# Patient Record
Sex: Female | Born: 1955 | Race: Black or African American | Hispanic: No | Marital: Married | Smoking: Former smoker
Health system: Southern US, Community
[De-identification: ages and names within clinical notes are randomized; demographics above are authoritative.]

## PROBLEM LIST (undated history)

## (undated) DIAGNOSIS — IMO0001 Reserved for inherently not codable concepts without codable children: Secondary | ICD-10-CM

## (undated) DIAGNOSIS — E785 Hyperlipidemia, unspecified: Secondary | ICD-10-CM

## (undated) DIAGNOSIS — I10 Essential (primary) hypertension: Secondary | ICD-10-CM

## (undated) DIAGNOSIS — I214 Non-ST elevation (NSTEMI) myocardial infarction: Secondary | ICD-10-CM

## (undated) DIAGNOSIS — I639 Cerebral infarction, unspecified: Secondary | ICD-10-CM

## (undated) DIAGNOSIS — T783XXA Angioneurotic edema, initial encounter: Secondary | ICD-10-CM

## (undated) DIAGNOSIS — J40 Bronchitis, not specified as acute or chronic: Secondary | ICD-10-CM

## (undated) DIAGNOSIS — M1990 Unspecified osteoarthritis, unspecified site: Secondary | ICD-10-CM

## (undated) HISTORY — DX: Reserved for inherently not codable concepts without codable children: IMO0001

## (undated) HISTORY — PX: KNEE ARTHROSCOPY W/ MENISCAL REPAIR: SHX1877

## (undated) HISTORY — DX: Non-ST elevation (NSTEMI) myocardial infarction: I21.4

## (undated) HISTORY — DX: Angioneurotic edema, initial encounter: T78.3XXA

## (undated) HISTORY — PX: NECK SURGERY: SHX720

## (undated) HISTORY — DX: Essential (primary) hypertension: I10

## (undated) HISTORY — DX: Cerebral infarction, unspecified: I63.9

## (undated) HISTORY — DX: Hyperlipidemia, unspecified: E78.5

## (undated) SURGERY — Surgical Case
Anesthesia: *Unknown

---

## 1999-03-01 ENCOUNTER — Emergency Department (HOSPITAL_COMMUNITY): Admission: EM | Admit: 1999-03-01 | Discharge: 1999-03-01 | Payer: Self-pay | Admitting: Emergency Medicine

## 1999-03-01 ENCOUNTER — Encounter: Payer: Self-pay | Admitting: Emergency Medicine

## 1999-03-09 ENCOUNTER — Ambulatory Visit (HOSPITAL_COMMUNITY): Admission: RE | Admit: 1999-03-09 | Discharge: 1999-03-09 | Payer: Self-pay | Admitting: Emergency Medicine

## 1999-03-09 ENCOUNTER — Encounter: Payer: Self-pay | Admitting: Emergency Medicine

## 1999-03-31 ENCOUNTER — Emergency Department (HOSPITAL_COMMUNITY): Admission: EM | Admit: 1999-03-31 | Discharge: 1999-03-31 | Payer: Self-pay

## 2001-03-03 ENCOUNTER — Encounter: Payer: Self-pay | Admitting: Cardiology

## 2001-03-03 ENCOUNTER — Ambulatory Visit (HOSPITAL_COMMUNITY): Admission: RE | Admit: 2001-03-03 | Discharge: 2001-03-03 | Payer: Self-pay | Admitting: Cardiology

## 2003-06-17 ENCOUNTER — Inpatient Hospital Stay (HOSPITAL_COMMUNITY): Admission: EM | Admit: 2003-06-17 | Discharge: 2003-06-19 | Payer: Self-pay | Admitting: Emergency Medicine

## 2003-09-26 ENCOUNTER — Ambulatory Visit (HOSPITAL_COMMUNITY): Admission: RE | Admit: 2003-09-26 | Discharge: 2003-09-26 | Payer: Self-pay | Admitting: Family Medicine

## 2004-01-19 ENCOUNTER — Inpatient Hospital Stay (HOSPITAL_COMMUNITY): Admission: AD | Admit: 2004-01-19 | Discharge: 2004-01-23 | Payer: Self-pay | Admitting: Cardiology

## 2004-01-19 ENCOUNTER — Encounter: Payer: Self-pay | Admitting: Emergency Medicine

## 2004-02-17 ENCOUNTER — Emergency Department (HOSPITAL_COMMUNITY): Admission: EM | Admit: 2004-02-17 | Discharge: 2004-02-17 | Payer: Self-pay | Admitting: Emergency Medicine

## 2004-06-22 ENCOUNTER — Emergency Department (HOSPITAL_COMMUNITY): Admission: EM | Admit: 2004-06-22 | Discharge: 2004-06-22 | Payer: Self-pay | Admitting: Family Medicine

## 2004-08-22 ENCOUNTER — Inpatient Hospital Stay (HOSPITAL_COMMUNITY): Admission: EM | Admit: 2004-08-22 | Discharge: 2004-08-27 | Payer: Self-pay | Admitting: Emergency Medicine

## 2004-08-24 ENCOUNTER — Encounter (INDEPENDENT_AMBULATORY_CARE_PROVIDER_SITE_OTHER): Payer: Self-pay | Admitting: Cardiovascular Disease

## 2005-01-04 ENCOUNTER — Inpatient Hospital Stay (HOSPITAL_COMMUNITY): Admission: EM | Admit: 2005-01-04 | Discharge: 2005-01-07 | Payer: Self-pay | Admitting: Emergency Medicine

## 2005-01-05 ENCOUNTER — Encounter (INDEPENDENT_AMBULATORY_CARE_PROVIDER_SITE_OTHER): Payer: Self-pay | Admitting: *Deleted

## 2005-06-06 ENCOUNTER — Emergency Department (HOSPITAL_COMMUNITY): Admission: EM | Admit: 2005-06-06 | Discharge: 2005-06-06 | Payer: Self-pay | Admitting: Emergency Medicine

## 2005-06-13 ENCOUNTER — Emergency Department (HOSPITAL_COMMUNITY): Admission: EM | Admit: 2005-06-13 | Discharge: 2005-06-13 | Payer: Self-pay | Admitting: Emergency Medicine

## 2005-06-29 ENCOUNTER — Inpatient Hospital Stay (HOSPITAL_COMMUNITY): Admission: EM | Admit: 2005-06-29 | Discharge: 2005-07-02 | Payer: Self-pay | Admitting: Emergency Medicine

## 2005-09-24 ENCOUNTER — Emergency Department (HOSPITAL_COMMUNITY): Admission: EM | Admit: 2005-09-24 | Discharge: 2005-09-24 | Payer: Self-pay | Admitting: Emergency Medicine

## 2005-12-19 ENCOUNTER — Emergency Department (HOSPITAL_COMMUNITY): Admission: EM | Admit: 2005-12-19 | Discharge: 2005-12-19 | Payer: Self-pay | Admitting: Emergency Medicine

## 2006-05-25 ENCOUNTER — Emergency Department (HOSPITAL_COMMUNITY): Admission: EM | Admit: 2006-05-25 | Discharge: 2006-05-25 | Payer: Self-pay | Admitting: Emergency Medicine

## 2006-07-03 ENCOUNTER — Emergency Department (HOSPITAL_COMMUNITY): Admission: EM | Admit: 2006-07-03 | Discharge: 2006-07-03 | Payer: Self-pay | Admitting: Emergency Medicine

## 2006-11-27 ENCOUNTER — Emergency Department (HOSPITAL_COMMUNITY): Admission: EM | Admit: 2006-11-27 | Discharge: 2006-11-28 | Payer: Self-pay | Admitting: Emergency Medicine

## 2006-12-06 ENCOUNTER — Emergency Department (HOSPITAL_COMMUNITY): Admission: EM | Admit: 2006-12-06 | Discharge: 2006-12-06 | Payer: Self-pay | Admitting: Emergency Medicine

## 2007-03-13 ENCOUNTER — Emergency Department (HOSPITAL_COMMUNITY): Admission: EM | Admit: 2007-03-13 | Discharge: 2007-03-13 | Payer: Self-pay | Admitting: Emergency Medicine

## 2007-04-10 ENCOUNTER — Emergency Department (HOSPITAL_COMMUNITY): Admission: EM | Admit: 2007-04-10 | Discharge: 2007-04-10 | Payer: Self-pay | Admitting: Emergency Medicine

## 2007-07-18 ENCOUNTER — Observation Stay (HOSPITAL_COMMUNITY): Admission: EM | Admit: 2007-07-18 | Discharge: 2007-07-19 | Payer: Self-pay | Admitting: Emergency Medicine

## 2007-07-27 ENCOUNTER — Inpatient Hospital Stay (HOSPITAL_COMMUNITY): Admission: EM | Admit: 2007-07-27 | Discharge: 2007-07-29 | Payer: Self-pay | Admitting: Emergency Medicine

## 2008-05-13 ENCOUNTER — Emergency Department (HOSPITAL_COMMUNITY): Admission: EM | Admit: 2008-05-13 | Discharge: 2008-05-13 | Payer: Self-pay | Admitting: Emergency Medicine

## 2008-09-28 ENCOUNTER — Inpatient Hospital Stay (HOSPITAL_COMMUNITY): Admission: EM | Admit: 2008-09-28 | Discharge: 2008-10-04 | Payer: Self-pay | Admitting: Neurosurgery

## 2008-09-28 ENCOUNTER — Encounter: Admission: RE | Admit: 2008-09-28 | Discharge: 2008-09-28 | Payer: Self-pay | Admitting: Emergency Medicine

## 2009-06-10 ENCOUNTER — Inpatient Hospital Stay (HOSPITAL_COMMUNITY): Admission: EM | Admit: 2009-06-10 | Discharge: 2009-06-12 | Payer: Self-pay | Admitting: Emergency Medicine

## 2009-06-27 ENCOUNTER — Emergency Department (HOSPITAL_COMMUNITY): Admission: EM | Admit: 2009-06-27 | Discharge: 2009-06-27 | Payer: Self-pay | Admitting: Emergency Medicine

## 2009-12-30 ENCOUNTER — Emergency Department (HOSPITAL_COMMUNITY): Admission: EM | Admit: 2009-12-30 | Discharge: 2009-12-31 | Payer: Self-pay | Admitting: Emergency Medicine

## 2010-05-05 LAB — CBC
HCT: 35.2 % — ABNORMAL LOW (ref 36.0–46.0)
Hemoglobin: 11.8 g/dL — ABNORMAL LOW (ref 12.0–15.0)
MCH: 28.5 pg (ref 26.0–34.0)
MCV: 85 fL (ref 78.0–100.0)
Platelets: UNDETERMINED 10*3/uL (ref 150–400)
RBC: 4.14 MIL/uL (ref 3.87–5.11)
RDW: 14.4 % (ref 11.5–15.5)

## 2010-05-05 LAB — DIFFERENTIAL
Eosinophils Absolute: 0.3 10*3/uL (ref 0.0–0.7)
Lymphocytes Relative: 33 % (ref 12–46)
Lymphs Abs: 2.6 10*3/uL (ref 0.7–4.0)
Monocytes Absolute: 0.5 10*3/uL (ref 0.1–1.0)
Monocytes Relative: 6 % (ref 3–12)
Neutro Abs: 4.5 10*3/uL (ref 1.7–7.7)

## 2010-05-05 LAB — POCT CARDIAC MARKERS
CKMB, poc: 2.4 ng/mL (ref 1.0–8.0)
Myoglobin, poc: 52 ng/mL (ref 12–200)
Troponin i, poc: <0.05 ng/mL (ref 0.00–0.09)

## 2010-05-05 LAB — BASIC METABOLIC PANEL
CO2: 24 mEq/L (ref 19–32)
GFR calc non Af Amer: >60 mL/min (ref >60)
Glucose, Bld: 107 mg/dL — ABNORMAL HIGH (ref 70–99)

## 2010-05-12 LAB — BASIC METABOLIC PANEL
BUN: 14 mg/dL (ref 6–23)
CO2: 24 mEq/L (ref 19–32)
CO2: 28 mEq/L (ref 19–32)
Calcium: 9.1 mg/dL (ref 8.4–10.5)
Calcium: 8.5 mg/dL (ref 8.4–10.5)
Calcium: 9.3 mg/dL (ref 8.4–10.5)
Creatinine, Ser: 0.79 mg/dL (ref 0.4–1.2)
Creatinine, Ser: 0.79 mg/dL (ref 0.4–1.2)
GFR calc Af Amer: >60 mL/min (ref >60)
GFR calc Af Amer: >60 mL/min (ref >60)
GFR calc Af Amer: >60 mL/min (ref >60)
GFR calc non Af Amer: 50 mL/min — ABNORMAL LOW (ref >60)
GFR calc non Af Amer: >60 mL/min (ref >60)
GFR calc non Af Amer: >60 mL/min (ref >60)
Glucose, Bld: 92 mg/dL (ref 70–99)
Sodium: 139 mEq/L (ref 135–145)

## 2010-05-12 LAB — DIFFERENTIAL
Basophils Absolute: 0 10*3/uL (ref 0.0–0.1)
Basophils Relative: 1 % (ref 0–1)
Basophils Relative: 0 % (ref 0–1)
Eosinophils Absolute: 0.3 10*3/uL (ref 0.0–0.7)
Eosinophils Relative: 4 % (ref 0–5)
Lymphocytes Relative: 18 % (ref 12–46)
Lymphs Abs: 1.3 10*3/uL (ref 0.7–4.0)
Monocytes Absolute: 0.7 10*3/uL (ref 0.1–1.0)
Monocytes Relative: 9 % (ref 3–12)
Monocytes Relative: 8 % (ref 3–12)
Neutro Abs: 9.6 10*3/uL — ABNORMAL HIGH (ref 1.7–7.7)
Neutrophils Relative %: 71 % (ref 43–77)
Neutrophils Relative %: 71 % (ref 43–77)

## 2010-05-12 LAB — GLUCOSE, CAPILLARY: Glucose-Capillary: 96 mg/dL (ref 70–99)

## 2010-05-12 LAB — COMPREHENSIVE METABOLIC PANEL
ALT: 13 U/L (ref 0–35)
Albumin: 3.5 g/dL (ref 3.5–5.2)
Alkaline Phosphatase: 110 U/L (ref 39–117)
BUN: 10 mg/dL (ref 6–23)
CO2: 25 mEq/L (ref 19–32)
Calcium: 8.8 mg/dL (ref 8.4–10.5)
Chloride: 105 mEq/L (ref 96–112)
Creatinine, Ser: 0.53 mg/dL (ref 0.4–1.2)
GFR calc non Af Amer: >60 mL/min (ref >60)
Glucose, Bld: 123 mg/dL — ABNORMAL HIGH (ref 70–99)
Glucose, Bld: 144 mg/dL — ABNORMAL HIGH (ref 70–99)
Potassium: 3.8 mEq/L (ref 3.5–5.1)
Sodium: 140 mEq/L (ref 135–145)
Total Bilirubin: 0.5 mg/dL (ref 0.3–1.2)
Total Protein: 7.6 g/dL (ref 6.0–8.3)

## 2010-05-12 LAB — URINALYSIS, ROUTINE W REFLEX MICROSCOPIC
Hgb urine dipstick: NEGATIVE
Nitrite: NEGATIVE
Protein, ur: NEGATIVE mg/dL

## 2010-05-12 LAB — CBC
HCT: 33.8 % — ABNORMAL LOW (ref 36.0–46.0)
Hemoglobin: 11.7 g/dL — ABNORMAL LOW (ref 12.0–15.0)
Hemoglobin: 12.5 g/dL (ref 12.0–15.0)
MCHC: 34.9 g/dL (ref 30.0–36.0)
MCHC: 34.2 g/dL (ref 30.0–36.0)
MCHC: 34.4 g/dL (ref 30.0–36.0)
MCHC: 34.8 g/dL (ref 30.0–36.0)
MCV: 87.4 fL (ref 78.0–100.0)
MCV: 86.8 fL (ref 78.0–100.0)
MCV: 87.8 fL (ref 78.0–100.0)
RBC: 3.86 MIL/uL — ABNORMAL LOW (ref 3.87–5.11)
RBC: 3.41 MIL/uL — ABNORMAL LOW (ref 3.87–5.11)
RBC: 3.52 MIL/uL — ABNORMAL LOW (ref 3.87–5.11)
RBC: 3.95 MIL/uL (ref 3.87–5.11)
RBC: 4.17 MIL/uL (ref 3.87–5.11)
WBC: 7.6 10*3/uL (ref 4.0–10.5)
WBC: 13.6 10*3/uL — ABNORMAL HIGH (ref 4.0–10.5)
WBC: 20.2 10*3/uL — ABNORMAL HIGH (ref 4.0–10.5)
WBC: 21.9 10*3/uL — ABNORMAL HIGH (ref 4.0–10.5)

## 2010-05-12 LAB — URINE MICROSCOPIC-ADD ON

## 2010-05-12 LAB — RAPID STREP SCREEN (MED CTR MEBANE ONLY): Streptococcus, Group A Screen (Direct): POSITIVE — AB

## 2010-05-12 LAB — PROTIME-INR
INR: 1.06 (ref 0.00–1.49)
Prothrombin Time: 13.7 seconds (ref 11.6–15.2)

## 2010-05-12 LAB — APTT: aPTT: 29 seconds (ref 24–37)

## 2010-05-30 LAB — POCT I-STAT, CHEM 8
BUN: 17 mg/dL (ref 6–23)
Chloride: 103 mEq/L (ref 96–112)
Creatinine, Ser: 0.6 mg/dL (ref 0.4–1.2)
Glucose, Bld: 111 mg/dL — ABNORMAL HIGH (ref 70–99)
Potassium: 3.8 mEq/L (ref 3.5–5.1)

## 2010-05-30 LAB — BASIC METABOLIC PANEL
BUN: 8 mg/dL (ref 6–23)
Chloride: 102 mEq/L (ref 96–112)
Glucose, Bld: 121 mg/dL — ABNORMAL HIGH (ref 70–99)
Potassium: 4 mEq/L (ref 3.5–5.1)

## 2010-05-30 LAB — CBC
HCT: 42.1 % (ref 36.0–46.0)
MCV: 89.4 fL (ref 78.0–100.0)
Platelets: 250 10*3/uL (ref 150–400)
RBC: 4.71 MIL/uL (ref 3.87–5.11)
WBC: 10.4 10*3/uL (ref 4.0–10.5)

## 2010-05-30 LAB — DIFFERENTIAL
Eosinophils Absolute: 0 10*3/uL (ref 0.0–0.7)
Eosinophils Relative: 0 % (ref 0–5)
Lymphs Abs: 1.3 10*3/uL (ref 0.7–4.0)
Monocytes Relative: 6 % (ref 3–12)

## 2010-07-07 NOTE — Discharge Summary (Signed)
Teresa [name], Teresa [name]                 ACCOUNT NO.:  [id number]   MEDICAL RECORD NO.:  [id number]          PATIENT TYPE:  INP   LOCATION:  4732                         FACILITY:  [facility]   PHYSICIAN:  Mohan N. Sharyn Lull, M.D. DATE OF BIRTH:  1955/02/26   DATE OF ADMISSION:  07/18/2007  DATE OF DISCHARGE:  07/19/2007                               DISCHARGE SUMMARY   ADMITTING DIAGNOSES:  Chest pain, rule out myocardial infarction,  history of probable small non-Q-wave myocardial infarction in the past,  hypertension, compensated congestive heart failure, history of  cardiovascular accident, history of peptic ulcer disease, chronic  anemia, hypercholesteremia, history of tobacco abuse, and positive  family history of coronary artery disease.   FINAL DIAGNOSIS:  Status post chest pain, myocardial infarction ruled  out, negative Persantine Myoview, history of probable small non-Q-wave  myocardial infarction, hypertension, compensated systolic heart failure,  history of cardiovascular accident, history of peptic ulcer disease,  chronic anemia, hypercholesteremia, history of tobacco abuse, and  positive family history of coronary artery disease.   DISCHARGE HOME MEDICATIONS:  1. Ramipril 10 mg 1 capsule daily.  2. Cozaar 50 mg 1 tablet daily.  3. Lipitor 10 mg 1 daily.  4. Coreg 25 mg 1 tablet every 12 hours.  5. Imdur 30 mg 1 tablet daily.  6. Omeprazole 20 mg 1 tablet twice daily.  7. Aggrenox 25/200 1 capsule twice daily.  8. Nitrostat 0.4 mg sublingual use as directed.   ACTIVITY:  As tolerated.   DIET:  Low-salt, low-cholesterol diet.  The patient has been advised to  avoid sweets.   Follow up with me in 1 week.   CONDITION AT DISCHARGE:  Stable.   BRIEF HISTORY AND HOSPITAL COURSE:  Teresa [name] is a 55 year old black  female with past medical history significant for coronary artery  disease, history of probable very small non-Q-wave myocardial infarction  in the past,  hypertension, history of systolic heart failure, history of  peptic ulcer disease, chronic anemia, history of tobacco abuse, history  of CVA, and hypercholesteremia.  She came to the ER complaining of  retrosternal burning chest pain off and on since this morning, grade  8/10, localized without associated nausea, vomiting, or diaphoresis.  Denies shortness of breath.  Denies palpitation, lightheadedness, or  syncope.  Denies PND, orthopnea, and leg swelling.  States chest pain  feels similar in nature, when she had mild MI.  The patient was noted to  have minimally elevated troponin I of 0.07.  EKG done in the ER showed  normal sinus rhythm with nonspecific T-wave changes.  Denies any  relation of chest pain to food, breathing, or movement.   PAST MEDICAL HISTORY:  As above.   PAST SURGICAL HISTORY:  She had tubal ligation 20+ years ago, had  questionable throat surgery.   ALLERGIES:  No known drug allergies.   SOCIAL HISTORY:  She is married, has 2 children.  Smoked half-pack per  day for 20+ years, quit in 2002.  Works at [employer].  Drinks beer  occasionally socially.   FAMILY HISTORY:  Father died of  accidental death.  Mother is alive.  She  has coronary artery disease.  She is hypertensive, also has history of  congestive heart failure.   MEDICATION:  1. Ramipril 10 mg daily.  2. Cozaar 50 mg p.o. daily.  3. Coreg 25 mg every 12 hours.  4. Imdur 30 mg daily.  5. Lipitor 10 mg daily.  6. Prilosec 20 mg p.o. daily.  7. Aggrenox 1 p.o. b.i.d.   PHYSICAL EXAMINATION:  GENERAL:  She is alert, awake, and oriented x3 in  no acute distress.  VITAL SIGNS:  Blood pressure was 115/69 and pulse was 79 regular.  HEENT:  Conjunctivae were pink.  NECK:  No JVD.  No bruit.  LUNGS:  She had decreased breath sounds at the bases.  HEART:  There was soft systolic murmur.  No S3 or gallop.  ABDOMEN:  Soft.  Bowel sounds present.  Nontender.  EXTREMITIES: There is no clubbing, cyanosis, or  edema.   LABORATORY DATA:  EKG showed normal sinus rhythm with nonspecific T-wave  changes.  Hemoglobin was 10, hematocrit 28.9, and white count of 8.6.  Potassium was 3.7, BUN was 13, creatinine 0.8, and glucose 100.  Troponin I first set in the ER, point of care was 0.07.  Repeat troponin  I were 0.06 and 0.06.  CK was 106, MB was 1.4.  TSH was 2.99.  CRP was  slightly elevated 1.5.  Cholesterol was 130, LDL 51, and HDL was 40.   BRIEF HOSPITAL COURSE:  The patient was admitted to telemetry unit.  MI  was ruled out by serial enzymes and EKG.  The patient did not have any  further episodes of chest pain.  The patient was started on IV nitrates  and heparin.  The patient did not have any further episodes of chest  pain during the hospital stay.  The patient subsequently underwent  Persantine Myoview today, which showed no evidence of reversible  ischemia with EF of 46%.  The patient is off IV heparin and nitrates and  ambulating in hallway without any problems.  The patient will be  discharged home on above medications and he will be followed up in my  office in 1 week.      Eduardo Osier. Sharyn Lull, M.D.  Electronically Signed    MNH/MEDQ  D:  07/19/2007  T:  07/20/2007  Job:  [id number]   cc:   [place]ley [place]

## 2010-07-07 NOTE — Op Note (Signed)
NAMECHANDREA, Teresa [name]                 ACCOUNT NO.:  [id number]   MEDICAL RECORD NO.:  [id number]          PATIENT TYPE:  INP   LOCATION:  3110                         FACILITY:  [facility]   PHYSICIAN:  Clydene Fake, M.D.  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   PROCEDURE:  Anterior cervical decompression, diskectomy, and fusion at  C4-5, C5-6, and C6-7 with LifeNet allograft bone and Trestle anterior  cervical plate.   SURGEON:  Clydene Fake, MD   ANESTHESIA:  General endotracheal tube anesthesia.   ESTIMATED BLOOD LOSS:  300 mL.   BLOOD GIVEN:  None.   DRAINS:  None.   COMPLICATIONS:  None.   REASON FOR PROCEDURE:  The patient is a 55 year old woman with 2-week  history of severe neck and right arm pain, numbness, and weakness.  During the last 5 days had more and more trouble, progressive weakness,  trouble with fine motor skills in the hands worse to the right, started  to dragging her right leg, and some trouble with gait and trouble  controlling her arms and legs like she wants to.  An MRI was done and  the patient was sent to the emergency room because of large acute disk  herniation and with some other levels with spinal change and stenosis.  After evaluating this and talking to patient, it was decided to proceed  with surgery, a 3-level ACF with C4-5, C5-6, and C6-7 because of her  progressive myelopathy.   PROCEDURE IN DETAIL:  The patient was brought to the operating room, and  general anesthesia was induced.  The patient was placed in a  10-pound  Halter traction, prepped and draped in the sterile fashion.  The site of  incision injected with 10 mL of 1% lidocaine with epinephrine.  An  incision was then made from midline to the anterior border of the  sternocleidomastoid muscle on the left side of neck.  Incision was taken  down to the platysma, and hemostasis was obtained with Bovie  cauterization.  The platysma was incised and blunt dissection taken  through the anterior cervical fascia to the anterior cervical spine.  Two needles were placed into 2 interspaces.  X-rays were done and this  confirmed we were at the C4-5 and C5-6 interspaces.  The disk spaces  were incised with a 15 blade and partial diskectomy performed with  pituitary rongeurs as the needle was removed.  Longus colli muscles were  reflected laterally from C4 through C7 bilaterally.  Self-retaining  retractors were placed and we could see the C4-5 and C5-6 interspace.  Large osteophytes were at C5-6 and these were removed with Leksell  rongeur.  We then started diskectomy  with pituitary rongeurs and  curettes, removed anterior spurs at C4-5 level with Kerrison punches.  The C5-6 space was severely spondylotic and we are going have to drilled  down on the left spaces as we are going to do it as a second step after  we take care of all important disk herniation at C4-5.  Distraction pins  were placed in the C4 and C6 and interspace distracted.  Microscope was  brought in for microdissection.  At this point, diskectomy continued at  the C4-5 level with curettes and pituitary rongeurs and 1 and 2-mm  Kerrison punches.  We had to get down to the posterior disk.  We removed  couple of fragments that went looked like through the ligament but then  CSF was seen on x-ray and as these fragments came out of the dura.  We  can see the spinal cord and there were further fragments of disk on top  of spinal cord inside the dura and we carefully removed these  fragments.  There was couple of areas where there was thin membrane of dura still  there anteriorly with 2 large rents, one on left and one on the right.  We could see the spinal cord arachnoid still there. We did have an  opening into the CSF where there was more disk herniation anterior upto  the dura and both going cephalad and caudally and these were removed  with Kerrison punches and carefully then explored inside the dura again,  for confirming fragments.  When we had finished, we had good  decompression of the canal.  Bilateral foramen appeared open.  We placed  some Gelfoam and patties to cover the dura and then used a high-speed  drill to remove cartilaginous endplate, measured the height of disk  space to be 4 mm.  We then placed some Duragen over the dura and over  the dural rents and then tapped a 4-mm LifeNet allograft bone into place  in the C4-5 space.  We then used Tisseel tissue glue and escorted that  into the gutters and over the bone graft.  After that, we never saw CSF  fluid again.  Attention was then taken to C5-6 level where a high-speed  drill was used to drill through the disk space and end plates.  As we  got posteriorly, there was a lot of posterior disk protrusion and also  going cephalad and caudally, some thickened ligament.  We carefully  dissected ligament off the dura and it was adherent, but we were able to  dissect off and then continued with diskectomy, getting good central  decompression and then performing bilateral foraminotomies to make sure  nerve roots got out well.  We used high-speed drill to remove  cartilaginous endplate, measured height of disk space to be 4 mm and a 4-  mm placed LifeNet allograft bone was tapped into place at C5-6 level.  Distraction pins were removed at C4 and pins were placed back into C7,  distracted the interspace at C6-7 with the retractor down so we can see  the C6-7 interspace and this interspace was incised with 15  blade and  diskectomy done with pituitary rongeurs and curettes and then 1 and 2-mm  Kerrison punches were used to remove posterior disk osteophytes and  ligament decompressing the central canal and performing bilateral  foraminotomies.  We we had finished, we had good decompression of  central and bilateral foramen.  There were some fragments of disk seen  centrally and into the right and these were removed.  We irrigated with  antibiotic solution.  We removed cartilaginous endplates with high-speed  drill, measured height of disk space to be 5 mm and a 5-mm LifeNet  allograft bone was tapped into place, and countersunk couple of  millimeters.  Distraction pins were removed.  Hemostasis was obtained  with Gelfoam and thrombin.  Weight was removed from the traction.  A  Trestle anterior cervical plate was placed in the anterior cervical  spine.  Two screws were placed in C4, 2 in C5, 2 in C6, and 2 in C7.  These were tightened down.  Lateral x-rays were obtained and looked like  the C4 screws just through the cortex posteriorly, the others looked  good.  C4 screws were removed and 12 mm screws were placed.  We had good  hemostasis and we placed some more Tisseel tissue at the C4-5 space and  in the gutter at C5-6 with no further CSF seen.  We had very good  hemostasis and the retractors removed.  The platysma was closed with 3-0  Vicryl interrupted suture, subcutaneous tissue closed with 3-0 Vicryl  interrupted suture, and then the skin closed with benzoin and Steri-  Strips.  A dressing was placed.  The patient was placed in a soft  cervical collar, awoken from anesthesia, and transferred to recovery  room in stable condition.           ______________________________  Clydene Fake, M.D.     JRH/MEDQ  D:  09/28/2008  T:  09/29/2008  Job:  [id number]

## 2010-07-10 NOTE — H&P (Signed)
Teresa [name], HUEBSCH                 ACCOUNT NO.:  [id number]   MEDICAL RECORD NO.:  [id number]          PATIENT TYPE:  INP   LOCATION:  4735                         FACILITY:  [facility]   PHYSICIAN:  Ricki Rodriguez, M.D.  DATE OF BIRTH:  05/16/1955   DATE OF ADMISSION:  08/21/2004  DATE OF DISCHARGE:                                HISTORY & PHYSICAL   CHIEF COMPLAINT:  Difficulty with speech and drooling on the left side.   HISTORY OF PRESENT ILLNESS:  This 55 year old black female complained of  right-handed weakness with a difficulty in calling the dog followed by  drooling on the left side and speech disturbance lasting for two to three  hours. She also had difficulty focusing. Now, the patient feels near normal.  No chest pain but has some headache.   PAST MEDICAL HISTORY:  Negative for diabetes. Positive for hypertension for  three years. She quit smoking January 19, 2004 when she had a myocardial  infarction but a cardiac catheterization was unremarkable according to the  patient. No history of cholesterol elevation and positive family history of  coronary artery disease.   PAST SURGICAL HISTORY:  Tubal ligation 22 years ago.   CURRENT MEDICATIONS:  1.  Altace 10 mg 1 daily.  2.  Lasix 20 mg 1 daily.  3.  Nitroglycerin 0.4 mg 1 sublingual every five minutes x3 p.r.n. chest      pain.  4.  Imdur 30 mg 1 daily.   ALLERGIES:  No known drug allergies.   SOCIAL HISTORY:  The patient is married. Husband, Laban Emperor, is a 77 year old  and has hypertension. The patient has two sons, a 64 and 16 year old.   FAMILY HISTORY:  Mother alive at 53 with hypertension. Father died of  accident at the age of 34. The patient has one sister living and well at age  35. Does not have any brothers.   REVIEW OF SYSTEMS:  Unremarkable. Positive weight gain of 11 pounds in six  months after quitting smoking. Wears partial upper dentures. No history of  asthma, pneumonia, or hemoptysis. No history  of chest pain. Positive history  of shortness of breath. No leg edema, palpitation, or dizziness. No history  of GI bleed, hepatitis, but had a blood transfusion six months ago. No  history of kidney stones, strokes, seizures, psychiatric admissions, and  immunization not up to date.   PHYSICAL EXAMINATION:  VITAL SIGNS:  Pulse 96, respiratory rate 16, blood  pressure 160/100. Height 5 feet 7 inch. Weight 171 pounds.  HEENT:  The patient is normocephalic and atraumatic with pupils equal and  reactive to light. Extraocular movements intact. Has brown eyes and wears  wig.  NECK:  No JVD and no carotid bruit.  LUNGS:  Clear bilaterally.  HEART:  Normal S1 and S2.  ABDOMEN:  Soft and nontender.  EXTREMITIES:  No clubbing, cyanosis, or edema.  NEUROLOGICAL:  The patient moves all four extremities and has bilaterally  equal grips.   LABORATORY DATA:  Pending.   IMPRESSION:  1.  Hypertension.  2.  Transient ischemic attack.  RECOMMENDATIONS:  This patient will be admitted. She will have carotid  Doppler and 2-D echocardiogram. She will be started on Aggrenox twice daily  and continue home medications. She may need a neurologic evaluation. She may  need a repeat CT of her brain to rule out stroke.       ASK/MEDQ  D:  08/22/2004  T:  08/22/2004  Job:  [id number]

## 2010-07-10 NOTE — Discharge Summary (Signed)
[name], [name]                 ACCOUNT NO.:  [id number]   MEDICAL RECORD NO.:  [id number]          PATIENT TYPE:  INP   LOCATION:  3701                         FACILITY:  [facility]   PHYSICIAN:  Mohan N. Sharyn Lull, M.D. DATE OF BIRTH:  1956/01/09   DATE OF ADMISSION:  01/19/2004  DATE OF DISCHARGE:  01/23/2004                                 DISCHARGE SUMMARY   ADMISSION DIAGNOSES:  1.  Probable small non-Q-wave myocardial infarction.  2.  Decompensated congestive heart failure; etiology multifactorial, i.e.,      anemia, uncontrolled hypertension and myocardial infarction.  3.  Uncontrolled hypertension secondary to noncompliance to medication.  4.  Tobacco abuse.  5.  Hypochromic microcytic anemia.  6.  History of menorrhagia.  7.  Positive family history of coronary artery disease.   DISCHARGE DIAGNOSES:  1.  Status post non-Q-wave myocardial infarction.  2.  Compensated congestive heart failure.  3.  Hypertension.  4.  Hypochromic microcytic chronic anemia.  5.  History of menorrhagia.  6.  Positive family history of coronary artery disease.  7.  Tobacco abuse.   DISCHARGE HOME MEDICATIONS:  1.  Coreg 12.5 mg half tablet every 12 hours.  2.  Altace 10 mg one capsule daily.  3.  Baby aspirin 81 mg one tablet daily.  4.  Feosol 325 mg one tablet three times per day.   ACTIVITY:  Avoid heavy lifting, pushing or pulling for 48 hours.   DISCHARGE DIET:  Low salt, low cholesterol diet.   Post cardiac catheterization instructions have been given.   FOLLOW UP:  With me in one week and the patient has been advised to schedule  an appointment with OB/GYN for evaluation of menorrhagia.   CONDITION ON DISCHARGE:  Stable.   HISTORY OF PRESENT ILLNESS:  [name] is a 55 year old black female with  past medical history significant for hypertension, tobacco abuse, chronic  menorrhagia, positive family history of coronary artery disease.  She came  to the ER complaining of  retrosternal chest pain described as pressure,  tightness, grade 9/10, localized, increased with deep breathing and lying  down since 8 p.m. last night; associated with mild shortness of breath.  Denies any nausea, vomiting, or diaphoresis.  Denies palpitations,  lightheadedness or syncope.  Denies PND, orthopnea or leg swelling.  The  patient gives history of exertional chest pain, dyspnea, feeling weak and  tired for the last few months.  She states she has stopped her BP medication  approximately three or four months ago.  She also complains of chronic  menorrhagia lasting six days of menstrual cycle every month with large blood  clots.  She denies any abdominal pain, nausea, vomiting.  Denies any bright  red blood per rectum.  Denies chest pain at present or shortness of breath.  The patient was noted to be anemic and had also mildly elevated troponin I  in the ER.   PAST MEDICAL HISTORY:  As above.   PAST SURGICAL HISTORY:  She had a tubal ligation in the past.   ALLERGIES:  No known drug allergies.  MEDICATIONS:  None.   SOCIAL HISTORY:  She is married.  She has two children.  Smokes half pack  per day for 22 years.  She drinks beer and whiskey occasionally socially.  No history of drug abuse.   FAMILY HISTORY:  Mother is alive.  She has coronary artery disease.  She is  hypertensive.  Father died of accidental death at the age of 40.  One sister  in good health.   PHYSICAL EXAMINATION:  GENERAL APPEARANCE:  She was awake, alert and  oriented x3 in no acute distress.  VITAL SIGNS:  Blood pressure 155/98, pulse 112, sinus tachycardia on the  monitor.  HEENT:  Conjunctiva pale.  NECK:  Supple with positive JVD.  LUNGS:  She had bibasilar rales.  CARDIOVASCULAR:  S1 and S2 was normal.  There was a soft S3 gallop.  ABDOMEN:  Soft.  Bowel sounds were present.  Nontender.  EXTREMITIES:  There was no clubbing, cyanosis, or edema.   Her EKG showed  normal sinus rhythm with  left atrial enlargement.  There  were T-wave inversions in the lateral leads.   LABORATORY DATA:  CK 122, MB 3.5.  Second set CK was 103 and MB 2.1.  Third  set CK 93 and MB 1.9.  Fourth set CK 75 and MB 1.3.  Although her troponin I  was elevated at 4.10, second set was 4.39, 3.23, 3.46; today her CK is 84,  MB 3.4 and troponin I has come down to 1.13.  Her cholesterol was 126, LDL  49, HDL 53, triglycerides 120.  Potassium 3.6, chloride 104, bicarb 25,  glucose 94, BUN 5, creatinine 0.7.  Admission hemoglobin 8, hematocrit 25.8,  white count 10.2, MCV 69.4.  Repeat hemoglobin 9, hematocrit 28.1.  On  January 21, 2004, hemoglobin was 7.9, hematocrit 25.2.  The patient  received two units of packed rbc.  Post transfusion, her hemoglobin is 10.4,  hematocrit 32 which has been stable.  Stool for occult blood was negative.   BRIEF HOSPITAL COURSE:  The patient was admitted to telemetry unit.  The  patient ruled in for small non-Q-wave MI due to elevated troponin I and  typical chest pain.  The patient received two units of packed rbc during the  hospital stay.  The patient's hemoglobin has been stable since the last two  days.  The patient subsequently underwent left catheterization yesterday  which showed no evidence of significant coronary artery disease but showed  mildly dilated LV with EF of 35 to 40%.  The patient's groin is stable.  There is no evidence of hematoma or bruit.  The  patient has been ambulating in the hallway without any problems. The patient  did not have any further episodes of chest pain during the hospital stay.  The patient will be discharged home on the above medications and will be  followed up in my office in one week.       MNH/MEDQ  D:  01/23/2004  T:  01/23/2004  Job:  [id number]

## 2010-07-10 NOTE — H&P (Signed)
NAME:  Teresa [name], Teresa [name]                           ACCOUNT NO.:  [id number]   MEDICAL RECORD NO.:  [id number]                   PATIENT TYPE:  INP   LOCATION:  0345                                 FACILITY:  [facility]   PHYSICIAN:  Kela Millin, M.D.             DATE OF BIRTH:  30-Oct-1955   DATE OF ADMISSION:  06/17/2003  DATE OF DISCHARGE:                                HISTORY & PHYSICAL   CHIEF COMPLAINT:  Left-sided abdominal/flank pain.   HISTORY OF PRESENT ILLNESS:  The patient is a 55 year old, African-American  female who presents with complaints of left-sided abdominal pain as well as  nausea, vomiting and diarrhea x1 day.  The abdominal pain she describes as  cramping, sharp, mostly in her left flank area and severe in intensity.  She  reports that the pain is worsened by movement and she admits to some relief  after she has a bowel movement.  She has had diarrhea x6 episodes, watery  brown stools and denies melena and hematochezia.  She also reports nausea  and vomiting x4 episodes.  She denies hematemesis and hemoptysis.  She  denies any sick contacts or eating any foods suspicious of causing her  symptoms.  The patient admitted to urinary frequency x1 day, but denied  dysuria or fevers.   In the ER, a CT scan of the abdomen was done and per ER physician, showed an  area of left renal infection.  The patient was also found to be anemic and  she was admitted for further evaluation and management.   PAST MEDICAL HISTORY:  Negative except for remote history of anemia about 20  years ago.   MEDICATIONS:  None.   ALLERGIES:  No known drug allergies.   SOCIAL HISTORY:  Positive for tobacco with 1/2 pack per day x20 years.  She  drinks about a pint of gin two to three days per week, during weekends.  Denies illicit drug use.  She is married and works as a Advertising copywriter at [employer]   FAMILY HISTORY:  She denies history of hypertension, MI or diabetes in her  family.   REVIEW OF SYMPTOMS:  As per HPI, the patient also admits to heavy menstrual  periods lasting up to about seven days.  Her last menstrual period was May 31, 2003.  She also denies palpitations, chest pain, cough.   PHYSICAL EXAMINATION:  GENERAL:  The patient is a middle-aged, African-  American female appearing uncomfortable secondary to abdominal pain, in no  respiratory distress.  VITAL SIGNS:  Temperature 97.9, blood pressure 148/92, pulse 97,  respirations 24, O2 saturations 100%.  HEENT:  PERRL.  EOMI.  Sclerae anicteric.  Moist mucous membranes.  No oral  exudates.  NECK:  Supple.  No adenopathy, thyromegaly, carotid bruits or JVD.  LUNGS:  Clear to auscultation bilaterally.  CARDIAC:  Normal S1, S2, regular rate and rhythm.  No  murmurs appreciated  and no S3.  ABDOMEN:  Soft.  Bowel sounds present.  Left upper and lower abdominal  tenderness to palpation.  No rebound tenderness.  No masses palpable.  No  organomegaly appreciated.  Nondistended.  RECTAL:  No stool in rectal vault.  Clear mucus.  Guaiac negative.  No  masses palpable.  Normal sphincter tone.  EXTREMITIES:  No edema or cyanosis.  NEUROLOGIC:  Alert and oriented x3.  Cranial nerves 2-12 grossly intact.  Nonfocal exam.   LABORATORY DATA AND X-RAY FINDINGS:  UA with cloudy appearance, specific  gravity 1.016, small leukocyte esterase present, 3-6 wbc's and rare  bacteria.  Lipase is 16 and the amylase is 47.  WBC 11, hemoglobin 8.6,  hematocrit 27.1, MCV 72.2, platelet count 225.  Sodium 136, potassium 3.8,  chloride 107, CO2 21, glucose 104, BUN 11, creatinine 0.8, calcium 9.2.  Albumin 3.5, AST 34, ALT 16, Alk phos 77, total bilirubin 0.8.   CT scan of the abdomen with hypodense area associated with lateral portion  of left kidney wedge shaped, worrisome for area of infarction, per  radiologist.  CT scan of pelvis with enlarged uterus with inhomogeneous  texture likely secondary to multiple leiomyomata.    ASSESSMENT/PLAN:  1. Abdominal pain, probable urinary tract infection, also left renal     infarction of the left computed tomography scan.  Will start intravenous     antibiotics.  Ultrasound ordered to further evaluate and urine cultures     obtained.  The patient was started on intravenous antibiotics.  Will     monitor on telemetry for arrhythmias and further workup for any sources     of emboli as clinically appropriate.  Also, follow the patient's clinical     course and manage as indicated.  Stool studies also obtained for diarrhea     workup to follow.  Symptomatic relief of pain with medications and     intravenous fluids for hydration.  Amylase and lipase negative as above.  2. Anemia, microcytic, likely secondary to fibroids as per computed     tomography scan above.  Check stool guaiacs and obtain iron study/anemia     workup.  3. Elevated blood pressure.  The patient with no prior history of     hypertension.  Monitor blood pressure and if remains persistently     elevated, treat with antihypertensive as clinically appropriate.  4. History of alcohol use.  Will start thiamine, multivitamin and Ativan as     needed.                                               Kela Millin, M.D.    ACV/MEDQ  D:  06/18/2003  T:  06/18/2003  Job:  [id number]

## 2010-07-10 NOTE — Cardiovascular Report (Signed)
NAMEQUINNETTA, ROEPKE                 ACCOUNT NO.:  [id number]   MEDICAL RECORD NO.:  [id number]          PATIENT TYPE:  INP   LOCATION:  3707                         FACILITY:  [facility]   PHYSICIAN:  Mohan N. Sharyn Lull, M.D. DATE OF BIRTH:  1955/07/31   DATE OF PROCEDURE:  07/01/2005  DATE OF DISCHARGE:                              CARDIAC CATHETERIZATION   PROCEDURE:  Left cardiac catheterization with selective left and right  coronary angiography, left ventriculography via the right groin using  Judkins technique.   INDICATIONS FOR PROCEDURE:  Ms. 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  Denies any nausea, vomiting, diaphoresis.  Denies  palpitation, lightheadedness or syncope.  The patient received two  sublingual nitroglycerin and four baby aspirin with relief of chest pain.  Denies any exertional chest pain but complains of exertional dyspnea.  Denies PND, orthopnea, leg swelling.  The patient also complains of chronic  dry, hacking cough.  Denies any fever, chills.  Denies sore throat.   PAST MEDICAL HISTORY:  As above.   PAST SURGICAL HISTORY:  She had tubal ligation 20+ years ago, had throat  surgery 25+ years ago.   ALLERGIES:  She has no known drug allergies.  She has dry hacking cough  probably secondary to ACE.   MEDICATION AT HOME:  She is on:  1.  Coreg 25 mg p.o. q.12h.  2.  Altace 10 mg p.o. b.i.d.  3.  Cozaar 50 mg p.o. daily.  4.  Lipitor 10 mg p.o. daily.  5.  Protonix 40 mg p.o. daily.  6.  Aggrenox one p.o. b.i.d.   SOCIAL HISTORY:  She is married and has two children.   Smoked a half a pack  per day for 20+ years.  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  Born in  [place].  Drinks beer occasionally.   FAMILY HISTORY:  Father died of accidental death.  Mother is alive.  She is  hypertensive, has congestive heart failure.   EXAMINATION:  GENERAL:  She was alert, awake, oriented x3, in no acute  distress.  VITAL SIGNS:  Blood pressure was 146/75.  Pulse was 102, sinus tachycardia  on the monitor.  HEENT:  Conjunctivae were pink.  NECK:  Supple.  No JVD, no bruit.  LUNGS:  She has decreased breath sounds at the bases.  There was no rales or  rhonchi.  CARDIOVASCULAR:  S1, S2 was normal.  There was soft systolic murmur.  There  was no S3 gallop.  ABDOMEN:  Soft.  Bowel sounds are present, nontender.  EXTREMITIES:  There is no clubbing, cyanosis or edema.   EKG done in the ER showed normal sinus rhythm with nonspecific ST-T wave  changes.   BRIEF HOSPITAL COURSE:  The  patient was admitted to telemetry unit.  The  patient ruled in for a probable very small non-Q-wave myocardial infarction  due to minimally elevated troponin I.  The patient subsequently underwent  Persantine Myoview today in view of prior near-normal coronary arteries.  Persantine Myoview showed reversible perfusion defect in the mid and distal  anterior wall consistent with ischemia with EF of 48% and also showed  inferior and inferolateral wall infarct.  Discussed with the patient  regarding Persantine Myoview result finding and left catheterization,  possible PTCA stenting, its risks and benefits i.e. death, MI, stroke, need  for emergency CABG, risk of restenosis, local vascular complications.  Accepted and consented for the procedure.   PROCEDURE:  After obtaining the informed consent the patient was brought to  the catheterization laboratory and was placed on fluoroscopy table.  The  right groin was prepped and draped in usual fashion.  Xylocaine 2% was used  for local  anesthesia in the right groin.  With temperature of thin-wall  needle, a 6-French arterial sheath was placed.  Sheath was aspirated and  flushed.  Next, a 6-French left Judkins catheter was advanced over the wire  under fluoroscopic guidance up to the ascending aorta.  Wire was pulled out,  the catheter was aspirated and connected to the manifold.  The catheter was  further advanced and engaged into left coronary ostium.  Multiple views of  the left system were taken.  Next, the catheter was disengaged and was  pulled out over the wire and was replaced with 6-French left Judkins  catheter which was advanced over the wire under fluoroscopic guidance up to  the ascending aorta.  The wire was pulled out, the catheter was aspirated  and connected to the manifold.  This catheter could not be engaged into the  right coronary ostium.  This catheter was replaced with a __________  catheter which was advanced over the wire under fluoroscopic guidance up to  the ascending aorta.  Wire was pulled out, the catheter was aspirated and  connected to the manifold.  Catheter was further advanced and engaged into  right coronary ostium.  A single view of right coronary artery was obtained.  Next, the catheter was disengaged and was pulled out over the wire and was  replaced with 6-French pigtail catheter which was advanced over the wire  under fluoroscopic guidance up to the ascending aorta.  Wire was pulled out,  the catheter was aspirated and connected to the manifold.  Catheter was  further advanced across the aortic valve into the LV.  LV pressures were  recorded.  Next, left ventriculograph was done in 30-degree RAO position.  Post angiographic pressures were recorded from LV and then pullback  pressures were recorded from aorta.  There was no gradient across the aortic  valve.  Next, the pigtail catheter was pulled out over the wire.  Sheaths  were aspirated and flushed.  FINDINGS:  LV showed inferior  wall hypokinesia, EF of approximately 50%.  Left main was short which was patent.  LAD has 20-25% proximal stenosis.  Diagonal one is very, very small.  Diagonal two has 20-30% ostial and  proximal stenosis.  Diagonal three is very, very small.  Left circumflex is  large which is patent which tapers down after giving off OM-3 in AV groove.  OM-1 and OM-2 were very, very small which were less than 1 mm.  OM-3 is  large which is patent, which bifurcates into superior and inferior branch.  Both these branches were also patent.  RCA has 10-15% mild stenosis in the  mid portion.  The patient tolerated the procedure well.  There are no  complications.  The patient was transferred to recovery room in stable  condition.           ______________________________  Eduardo Osier Sharyn Lull, M.D.     MNH/MEDQ  D:  07/01/2005  T:  07/02/2005  Job:  [id number]   cc:   Cath Lab

## 2010-07-10 NOTE — Discharge Summary (Signed)
NAMEFLORINDA, TAFLINGER                 ACCOUNT NO.:  [id number]   MEDICAL RECORD NO.:  [id number]          PATIENT TYPE:  INP   LOCATION:  4729                         FACILITY:  [facility]   PHYSICIAN:  Mohan N. Sharyn Lull, M.D. DATE OF BIRTH:  10/05/1955   DATE OF ADMISSION:  01/04/2005  DATE OF DISCHARGE:  01/07/2005                                 DISCHARGE SUMMARY   ADMITTING DIAGNOSES:  1.  Chest pain, rule out myocardial infarction.  2.  Uncontrolled hypertension.  3.  Elevated blood sugar; rule out diabetes mellitus.  4.  Hypercholesterolemia.  5.  History of tobacco abuse.  6.  Compensated congestive heart failure.  7.  Hypochromic microcytic anemia.   DISCHARGE DIAGNOSES:  1.  Status post atypical chest pain, negative Persantine Myoview.  2.  Compensated congestive heart failure.  3.  Peptic ulcer disease.  4.  Hypochromic microcytic anemia, stable.  5.  Hypertension.  6.  Glucose intolerance.  7.  Hypercholesterolemia.  8.  Compensated congestive heart failure.  9.  History of tobacco abuse.  10. Tendinitis of left shoulder.  11. History of transient ischemic attack in the past.   DISCHARGE HOME MEDICATIONS:  1.  Aggrenox one capsule twice daily with food.  2.  Altace 10 mg one capsule daily.  3.  Coreg 35 mg one tablet every 12 hours.  4.  Lipitor 10 mg one tablet daily.  5.  Protonix 40 mg one tablet twice daily.  6.  Feosol 325 mg one tablet twice daily.  7.  Darvocet-N 100 one tablet every 6-8 hours for shoulder pain as needed.  8.  Colace 100 mg one tablet daily at night for constipation.  9.  Flexeril 10 mg one tablet daily.   FOLLOWUP:  Follow up with Dr. Elnoria Howard in 1 week and follow up with me in 1  week.   SPECIAL DISCHARGE INSTRUCTIONS:  The patient has been advised to avoid spicy  foods, Advil, Aleve, Goody's powder and aspirin.   DIET:  Low-salt, low-cholesterol, 1800-calorie ADA diet.  The patient has  been advised to avoid sweets.   CONDITION AT  DISCHARGE:  Stable.   BRIEF HISTORY:  Ms. Vollman is a 55 year old black female with past medical  history significant for dilated cardiomyopathy, history of mild non-Q-wave  myocardial infarction, hypertension, history of congestive heart failure,  history of TIA and hypercholesterolemia.  She came to the ER complaining of  retrosternal chest pain described as someone sitting on the chest, grade  7/10.  She took 2 sublingual nitroglycerin with relief.  She also complains  of musculoskeletal left shoulder pain and denies any nausea, vomiting, or  diaphoresis, denies palpitations, lightheadedness or syncope, denies PND,  orthopnea or leg swelling.  She states she had pain fairly similar in nature  when she had a minor heart attack.  She denies any weakness, slurred speech  or gait problem, denies cough, fever or chills, denies abdominal pain and  denies bright red blood per rectum, denies urinary complaints.   PAST MEDICAL HISTORY:  As above.   PAST SURGICAL HISTORY:  She  had tubal ligation 20+ years ago and had throat  surgery 25 years ago.   MEDICATIONS AT HOME:  She was on:  1.  Aggrenox 25/200 p.o. twice daily  2.  Altace 10 mg p.o. daily.  3.  Coreg 25 mg p.o. q.12 h.  4.  Lipitor 10 mg p.o. daily.  5.  Protonix 40 mg p.o. daily.  6.  Feosol 325 mg p.o. daily.   ALLERGIES:  No known drug allergies.   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   FAMILY HISTORY:  Father died of accidental death in his 50s.  Mother is  alive; she is hypertensive; she has a history of congestive heart failure.   PHYSICAL EXAMINATION:  GENERAL:  On examination, she was awake, alert, and  oriented x3, in no acute distress.  VITAL SIGNS:  Blood pressure was 165/90, pulse was 76, regular.  HEENT:  Conjunctivae were pink.  NECK:  Supple.  No JVD.  No bruit.  LUNGS:  Lungs  were clear to auscultation without rhonchi or rales.  CARDIOVASCULAR:  S1 and S2 were normal.  There was a soft systolic murmur.  There was an S3 gallop.  ABDOMEN:  Soft.  Bowel sounds were present and nontender.  EXTREMITIES:  There is no clubbing, cyanosis, or edema.   LABORATORY DATA:  EKG showed normal sinus rhythm with poor R wave  progression in the inferior leads and minor T wave inversion in the lateral  leads.   Two sets of cardiac enzymes were normal; CK was 137, MB 1.8; second set --  CK 118, MB 1.8.  Troponin I was 0.04 and 0.04.  Her sodium was 137,  potassium 3.3, repeat potassium was 3.9, chloride 108, bicarb 21, glucose  104, BUN 9, creatinine 0.8.  Magnesium was 1.7.  Her reticulocyte count was  2.8.  Hemoglobin was 9.0, hematocrit 27.9; repeat hemoglobin was 8.4,  hematocrit 25.8; today, hemoglobin is 8.5, hematocrit 26.3, which has been  stable.  Her MCV was low at 75.8.   BRIEF HOSPITAL COURSE:  The patient was admitted to a telemetry unit.  MI  was ruled out by serial enzymes and EKG.  The patient subsequently underwent  Persantine Myoview, which showed no evidence of inducible left ventricular  myocardial ischemia.  There was anteroapical fixed scar and left ventricular  chamber enlargement with EF of 43%.  A GI consultation was also obtained due  to hypochromic microcytic anemia.  The patient subsequently underwent upper  endoscopy, which showed gastric ulcers and esophagitis, and also underwent  colonoscopy which showed polyps and external hemorrhoids.  Polyps were  resected by cold snare.  Pathology report is pending.  The patient's  hemoglobin has been stable.  The patient did not have any episodes of chest  pain during the hospital stay.  The patient also had x-ray of the left  shoulder due to left shoulder with pain which showed tendinitis of the left  shoulder; the patient was started on a muscle relaxant and hydromorphone with significant improvement in  her pain.  The patient will be discharged  home on the above medications and will be followed up in my office in 1 week  and GI in 1 week.           ______________________________  Eduardo Osier. Sharyn Lull, M.D.     MNH/MEDQ  D:  01/07/2005  T:  01/08/2005  Job:  [id number]   cc:   Jordan Hawks. Elnoria Howard, MD  Fax: [phone]

## 2010-07-10 NOTE — Discharge Summary (Signed)
Teresa [name], Teresa [name]                 ACCOUNT NO.:  [id number]   MEDICAL RECORD NO.:  [id number]          PATIENT TYPE:  INP   LOCATION:  3707                         FACILITY:  [facility]   PHYSICIAN:  Mohan N. Sharyn Lull, M.D. DATE OF BIRTH:  02-24-1955   DATE OF ADMISSION:  06/29/2005  DATE OF DISCHARGE:  07/02/2005                                 DISCHARGE SUMMARY   ADMITTING DIAGNOSES:  1.  Probable very small non-Q-wave myocardial infarction.  2.  Dilated cardiomyopathy.  3.  Compensated congestive heart failure.  4.  Hypertension.  5.  History of peptic ulcer disease.  6.  Chronic anemia.  7.  History of cerebrovascular accident.  8.  Hypercholesteremia.  9.  Bronchitis.   DISCHARGE DIAGNOSES:  1.  Status post probable very small non-Q-wave myocardial infarction status      post left catheterization, positive Persantine Myoview.  2.  Compensated congestive heart failure.  3.  Hypertension.  4.  History of peptic ulcer disease.  5.  Chronic anemia.  6.  History of cerebrovascular accident.  7.  Hypercholesteremia.  8.  Status post bronchitis.   DISCHARGE HOME MEDICATIONS:  1.  Aggrenox one capsule twice daily.  2.  Coreg 25 mg one tablet every 12 hours.  3.  Altace 10 mg one capsule twice daily.  4.  Cozaar 50 mg one tablet daily.  5.  Lipitor 10 mg one tablet daily.  6.  Protonix 40 mg one tablet daily.  7.  Feosol one tablet twice daily.  8.  Nitrostat 0.4 mg sublingual use as directed.   INSTRUCTIONS:  Diet low salt, low cholesterol.  Post cardiac cath  instructions have been given.  The patient has been advised to avoid any  heavy lifting, pushing for 48 hours and will follow up with me in 1 week.  Condition at discharge is stable.   BRIEF HISTORY AND HOSPITAL COURSE:  Teresa [name] is a 55 year old black  female with past medical history significant for hypertension, history of  congestive heart failure, history of peptic ulcer disease, history of mild  non-Q-wave MI  in the past, chronic anemia, hypercholesteremia, tendinitis of  the shoulder, history of CVA.  She came to the ER complaining of  retrosternal and left-sided chest pain dull, aching, localized, rated 8/10,  associated with mild shortness of breath.  Denies any nausea, vomiting,  diaphoresis, palpitation, lightheadedness or syncope.  The patient received  two sublingual nitroglycerin and four baby aspirin with relief of chest  pain.  Denies any exertional chest pain but complains of exertional dyspnea.  Denies PND, orthopnea, leg swelling.  Complains of chronic dry hacking  cough.  Denies any fever, chills or sore throat.   PAST MEDICAL HISTORY:  As above.   PAST SURGICAL HISTORY:  She had tubal ligation 20+ years ago and throat  surgery approximately 25 years ago.   ALLERGIES:  NO KNOWN DRUG ALLERGIES.  QUESTIONABLE INTOLERANCE TO ACE  SECONDARY TO COUGHING.   MEDICATIONS AT HOME:  1.  Coreg 25 mg p.o. q.12 h.  2.  Altace 10 mg p.o. b.i.d.  3.  Cozaar 50 mg p.o. daily.  4.  Lipitor 10 mg p.o. daily.  5.  Protonix 40 mg p.o. daily.  6.  Aggrenox one capsule twice daily.   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   FAMILY HISTORY:  Father died of accidental death.  Mother is alive.  She has  history of __________.   EXAMINATION:  GENERAL:  She was alert, awake and oriented x3 in no acute  distress.  VITAL SIGNS:  Blood pressure was 146/75, pulse was 102, sinus tachycardia on  the monitor.  HEENT:  Conjunctiva was pink.  NECK:  Supple, no JVD, no bruit.  LUNGS:  Decreased breath sounds at the bases.  There were no rales.  CARDIOVASCULAR:  S1 and S2 was normal with soft systolic murmur at the apex.  There was no S3 gallop.  ABDOMEN:  Abdomen was soft, bowel sounds are present, nontender.  EXTREMITIES:  There is no clubbing,  cyanosis or edema.   LABORATORIES:  Hemoglobin was 8.4, hematocrit 25.8, white count of 9.5,  potassium was 3.7, BUN was 9, creatinine 0.8.  Her point-of-care cardiac  markers CPK-MB was 2.2, troponin I was 0.05.  By the lab her CK was 158, MB  2.9, relative index 1.8, troponin I was slightly high at 0.10.  Her sodium  was 137, potassium 3.6, glucose 114, repeat fasting sugar was 102, BUN was  9, creatinine 0.8, hemoglobin was 8.4, hematocrit 25.8, white count of 11.5  which has been stable.  Second set of cardiac enzymes:  CK 151, MB 2.5,  troponin I came down to 0.09.  Chest x-ray showed no acute abnormalities.  Persantine-Cardiolite showed reversible perfusion defect in mid and distal  anterior wall consistent with ischemia, EF of 48% which has improved from  prior to angiogram.   BRIEF HOSPITAL COURSE:  The patient was admitted to telemetry unit.  The  patient was ruled in for probable small non-Q-wave MI due to elevated  troponin I and typical anginal chest pain.  The patient subsequently  underwent Persantine Myoview which showed reversible ischemia in the mid and  distal anterior wall and subsequently underwent left cath as per procedure  report.  The patient tolerated procedure well.  There were no complications.  Her groin is stable.  There is no evidence of hematoma or bruit.  The  patient has been ambulating in hallway without any problems.  The patient  did not have any further episodes of chest pain during the hospital stay.  The patient will be discharged home on above medications and will be  followed up in my office in 1 week.           ______________________________  Eduardo Osier. Sharyn Lull, M.D.     MNH/MEDQ  D:  07/02/2005  T:  07/03/2005  Job:  [id number]

## 2010-07-10 NOTE — Discharge Summary (Signed)
NAME:  Teresa [name], CULMER                           ACCOUNT NO.:  [id number]   MEDICAL RECORD NO.:  [id number]                   PATIENT TYPE:  INP   LOCATION:  0345                                 FACILITY:  [facility]   PHYSICIAN:  Corinna L. Lendell Caprice, MD             DATE OF BIRTH:  May 17, 1955   DATE OF ADMISSION:  06/17/2003  DATE OF DISCHARGE:  06/19/2003                                 DISCHARGE SUMMARY   DIAGNOSES:  1. Left renal infarction.  2. Iron deficiency anemia.  3. Tobacco abuse.   DISCHARGE MEDICATIONS:  1. Vicodin 1-2 p.o. q.4h. p.r.n. pain; 10 were dispensed.  2. Iron sulfate 325 mg p.o. daily.   FOLLOW UP:  Follow up with Dr. Laurann Montana at 11 a.m. on Monday, Jun 24, 2003, at which time echocardiogram should be followed up, as well as protein  C, protein S, antithrombin III level, factor V Leiden, and anticardiolipin  antibody.  She also needs a repeat CT scan of the kidneys in 3 months.   ACTIVITY:  Ad lib.   DIET:  Regular.   CONDITION ON DISCHARGE:  Stable.   CONSULTATIONS:  None.   PROCEDURES:  None.   PERTINENT LABORATORIES:  UA showed many epithelial cells.  This was repeated  and showed negative nitrites, negative leukocyte esterase.  Urine culture  nebulizer.  CPK, MB, and troponin essentially normal.  Urine pregnancy  negative.  Complete metabolic panel was unremarkable.  Amylase and lipase  were normal.  PT and PTT normal.  White blood cell count 11,000, hemoglobin  8.6, hematocrit 27.1, MCV 72, platelet count 225.  Ferritin was 16.  Homocystine level 6.   SPECIAL STUDIES AND RADIOLOGY:  EKG showed normal sinus rhythm with  nonspecific changes, prolonged QT interval.  CT of the abdomen and pelvis  showed an enlarged uterus with multiple leiomyomata, 1.5 cm right ovarian  cyst, wedge-shaped hypodense area of the lateral portion of the left kidney  measuring 3.5 to 4 cm in size, worrisome for infarction.  Renal ultrasound  was normal.  Acute abdominal  series showed a focal ileus in the right upper  quadrant, questionable soft tissue density in right portion of the pelvis.   HISTORY AND HOSPITAL COURSE:  Teresa [name] is a 55 year old black female who  presented with nausea, vomiting, and diarrhea.  She also had left-sided  abdominal and flank pain.  She has a smoking history, but otherwise has no  health problems.  She was afebrile and had normal vital signs.  She had left-  sided abdominal tenderness, and CVA tenderness.  CT scan showed a renal  infarction.  Initially, the patient was felt to have a urinary tract  infection, as well, as she had small leukocyte esterase, but this UA was  contaminated with many epithelial cells.  When it was repeated, it was  negative.  The patient initially was started on IV fluids,  anti-emetics,  pain medication, and ciprofloxacin.  The antibiotics were stopped.  The  patient improved tremendously.  A hypercoagulable work-up was started, and  several of the results are pending (see above).  She also had an  echocardiogram to rule out thromboembolic disease, but was unwilling to wait  for the result, and so I set up a follow up appointment with Dr. Laurann Montana.  At the time of discharge, the patient was afebrile, had normal vital  signs, was tolerating a regular diet, and had minimal pain, and so I feel it  is safe to discharge her with outpatient follow up.                                                Corinna L. Lendell Caprice, MD    CLS/MEDQ  D:  06/19/2003  T:  06/20/2003  Job:  [id number]   cc:   Stacie Acres. White, M.D.  [address]  [place]  [place] [address]  Fax: [phone]

## 2010-07-10 NOTE — Consult Note (Signed)
Teresa [name], Teresa [name]                 ACCOUNT NO.:  [id number]   MEDICAL RECORD NO.:  [id number]          PATIENT TYPE:  INP   LOCATION:  4729                         FACILITY:  [facility]   PHYSICIAN:  Anselmo Rod, M.D.  DATE OF BIRTH:  October 26, 1955   DATE OF CONSULTATION:  01/04/2005  DATE OF DISCHARGE:                                   CONSULTATION   REASON FOR CONSULTATION:  Iron-deficiency anemia with a hemoglobin of 9  g/dl.   ASSESSMENT:  1.  Iron-deficiency anemia rule out peptic ulcer disease versus esophagitis,      colonic masses, etc.  2.  Chest pain with negative cardiac enzymes today for stress test tomorrow.  3.  History of constipation secondary to iron supplements started a year      ago.  4.  History of nonischemic dilated cardiomyopathy.  5.  History of transient ischemic attack in June 2006, on Aggrenox.  6.  History of a mild non-Q-wave myocardial infarction in the past.  7.  History of hypertension.  8.  History of nonsustained ventricular tachycardia in the past.  9.  History of congestive heart failure.  10. Status post bilateral tubal ligation in the remote past.  11. History of tobacco abuse with half pack per day smoked over the last 25      years, quit a year ago.  12. Two normal vaginal deliveries in the remote past.  13. Nasopharyngeal surgery several years ago, details not known.   RECOMMENDATIONS:  1.  EGD in [facility] on Aggrenox tomorrow morning.      Will prep with MiraLax tonight.  Further recommendations made      thereafter.  Procedure will be done by Dr. Elnoria Howard.  2.  Hold off on iron supplements for now.   DISCUSSION:  [name] is a 55 year old African American female with  above mentioned medical problems who came to the emergency room again with  chest pain radiating to her left arm.  She denies any nausea, vomiting,  fever, chills, rigors, abdominal pain, hematochezia or melena.  She does  have darker stools  since she has been on iron.  She takes iron  intermittently because it makes her constipated.  She denies having an EGD  or colonoscopy in the past.  There is no history of peptic ulcer disease,  esophagitis or gastritis.  Her appetite is good and weight has been stable.  She averages one bowel movement every day or every other day.  There is no  known family history of colon cancer or other GI malignancies.  As per my  discussion with Dr. Sharyn Lull, patient is scheduled for stress test tomorrow.  She had a TIA in June of this year when she was started on Aggrenox.  There  are no present or previous GI problems according to the patient.   PAST MEDICAL HISTORY:  See list above.   ALLERGIES:  NO KNOWN DRUG ALLERGIES.   HOME MEDICATIONS:  1.  Aggrenox 25/200.  2.  Coreg.  3.  NitroQuick p.r.n.  4.  Lipitor.  5.  Altace.   SOCIAL HISTORY:  She works in Stage manager at [employer].  She is married and she  has two grown sons.  She denies the use of illicit street drugs.  She drinks  a glass of wine occasionally.  She stopped smoking a year ago. She smoked a  half pack per day for 25 years before she quit.   FAMILY HISTORY:  Noncontributory.  There is no family history of breast,  ovarian, uterine, cervical, colon or endometrial cancer.   PHYSICAL EXAMINATION:  GENERAL APPEARANCE:  A very pleasant, cooperative,  middle age African American in no acute distress.  VITAL SIGNS:  Blood pressure 165/90, temperature 98.4, pulse 76 per minute,  respiratory rate 20.  HEENT:  Atraumatic and normocephalic head.  Facial symmetry preserved.  Orophyrangeal mucosa are without exudates.  NECK:  Supple, no JVD, thyromegaly or lymphadenopathy.  CHEST:  Clear to auscultation. No rhonchi or wheezing.  CARDIOVASCULAR:  S1 and S2 regular, no murmurs, rubs, or gallops.  ABDOMEN:  Soft, nondistended, slightly obese with a surgical scar present on  the umbilicus from previous BT and nontender with normal bowel  sounds.  No  hepatosplenomegaly appreciated.  RECTAL:  Moderate sphincterotome with guaiac negative stools.  No other  masses palpable on digital examination.   LABORATORY DATA:  In the ER, white count 7.2 with hemoglobin of 9 gm/dl with  a Hematocrit 36.6%, MCV 75.6 and platelets 230. Lymphocytes 21%, neutrophils  65%.  Cardiac enzymes were negative.  Reticulocyte count 2.8%.  CK total was  133 with MB fraction of 1.8 ng/mL.  PT was 13 seconds with INR of 1.  Troponin I was 0.04 ng/mL.   PLAN:  As above.  Patient will be prepped for EGD and colonoscopy tomorrow  and the procedure will be done on Aggrenox as discussed with Dr. Sharyn Lull.  Further recommendation will be made in follow-up.  Iron will be held until  after procedures are done.      Anselmo Rod, M.D.  Electronically Signed     JNM/MEDQ  D:  01/04/2005  T:  01/04/2005  Job:  [id number]   cc:   Genene Churn. Love, M.D.  Fax: [phone]

## 2010-07-10 NOTE — Discharge Summary (Signed)
Teresa [name], Teresa [name]                 ACCOUNT NO.:  [id number]   MEDICAL RECORD NO.:  [id number]          PATIENT TYPE:  INP   LOCATION:  4735                         FACILITY:  [facility]   PHYSICIAN:  Mohan N. Sharyn Lull, M.D. DATE OF BIRTH:  May 21, 1955   DATE OF ADMISSION:  08/21/2004  DATE OF DISCHARGE:  08/27/2004                                 DISCHARGE SUMMARY   ADMISSION DIAGNOSES:  Hypertension, transient ischemic attack, rule out  cerebrovascular accident.   FINAL DIAGNOSES:  Status post transient ischemic attack, nonischemic dilated  cardiomyopathy, hypertension status post nonsustained ventricular  tachycardia asymptomatic.   DISCHARGE HOME MEDICATIONS:  1.  Altace 10 mg one capsule daily.  2.  Coreg 12.5 mg one tablet every 12 hours.  3.  Aggrenox 25/200 one capsule twice daily.  4.  Lipitor 10 mg one tablet daily.  5.  Ferrous sulfate 325 mg one tablet twice daily.  6.  Protonix 40 mg one tablet daily.   DIET:  Low salt, low cholesterol.   ACTIVITY:  As tolerated.   FOLLOW UP:  With me in one week and Dr. Sandria Manly in two weeks.   CONDITION ON DISCHARGE:  Stable.   BRIEF HISTORY AND HOSPITAL COURSE:  [name] is a 55 year old black  female with a past medical history significant for hypertension, nonischemic  dilated cardiomyopathy who was admitted to [facility] on August 21, 2004 because of right-handed weakness, difficult calling dog and drooling of  the left side at p.m., lasting approximately 1-2 hours.  Speech was slurred  and difficulty focusing.  The patient denies any complaints now and no chest  pain, no shortness of breath.  The patient did have some headache.   PAST MEDICAL HISTORY:  As above.   PAST SURGICAL HISTORY:  She had tubal ligation 22 years ago.   MEDICATIONS AT HOME:  1.  She was on Altace 10 mg p.o. daily.  2.  Enbrel 30 mg p.o. daily.  3.  Lasix 40 mg a half daily.  4.  Nitro-Dur sublingual p.r.n.   ALLERGIES:  NO KNOWN DRUG  ALLERGIES.   SOCIAL HISTORY:  She is married.  She has two sons.   FAMILY HISTORY:  Positive for hypertension.   PHYSICAL EXAMINATION:  GENERAL:  On examination, she is alert, awake and  oriented x3.  VITAL SIGNS:  Blood pressure is 160/100, pulse was 96, respirations 16.  HEENT:  Conjunctivae was pink, PERRLA.  Extraocular muscles were intact.  NECK:  Supple.  No JVD.  No bruits.  LUNGS:  Clear to auscultation without rhonchi or rales.  CARDIOVASCULAR:  S1 and S2 is normal.  The tone was soft.  EXTREMITIES:  There is no cyanosis, clubbing or edema.  NEUROLOGICAL:  Grossly intact.   LABORATORIES:  Acetylcholine binding antibody was 0.  TSH was 0.296 which  was slightly elevated.  Lipid panel:  Cholesterol was 131, triglyceride 193,  LDL 48, HDL 48.  Homocystine level was 8.22.  Sodium was 140, potassium 3.8,  chloride 109, bicarb 21, glucose 95, BUN 13, creatinine 1.0.  CT  scan of the  brain showed premature atrophy with small vessel disease.  No evidence of  acute stroke.  Negative MRA angiograph of the intracranial circulation  without contrast.  CT scan of the brain is negative.  Non contrast CT scan  of the brain.  EKG showed normal sinus rhythm with T wave abnormality in the  lateral leads.   BRIEF HOSPITAL COURSE:  A 2D echocardiogram showed moderate diffuse left  ventricular hypokinesis with an ejection fraction of 30-40%.  There was mild  mitral regurgitation.   The patient was admitted to the telemetry unit.  Neurologic consultation was  obtained.  Their impression was dysarthria, suspect transient ischemic  attack.  The patient did not have further episodes of dysarthria during the  hospital stay.  CT scan of the brain and MRI showed no evidence of acute  infarct.  The patient was discharged home on August 27, 2004 in stable  condition.           ______________________________  Eduardo Osier Sharyn Lull, M.D.     MNH/MEDQ  D:  12/31/2004  T:  12/31/2004  Job:  [id number]

## 2010-07-10 NOTE — Consult Note (Signed)
NAMESREEJA, SPIES                 ACCOUNT NO.:  [id number]   MEDICAL RECORD NO.:  [id number]          PATIENT TYPE:  INP   LOCATION:  4735                         FACILITY:  [facility]   PHYSICIAN:  Genene Churn. Love, M.D.    DATE OF BIRTH:  [birth year]   DATE OF CONSULTATION:  08/22/2004  DATE OF DISCHARGE:                                   CONSULTATION   REFERRING PHYSICIAN:  Ricki Rodriguez, M.D.   PATIENT INFORMATION:  Patient's address is [address], [place],  [place].   REASON FOR CONSULTATION:  This 55 year old right-handed black married female  from [place], [place] seen at the request of Dr. Ricki Rodriguez  to evaluate slurred speech.   HISTORY OF PRESENT ILLNESS:  Ms. Kanady has a past history of hypertension  for several years and quit smoking in November of 2005. She has had a  myocardial infarction also in November of 2005. The evening prior to  admission, at about 8 p.m., she was going to call her dog and had difficulty  with getting her words out, slurred speech, and drooling. This lasted for  about 20 minutes and was accompanied by some headache. She was admitted to  the hospital and today about 2:30 p.m. had an episode of slurred speech  lasting minutes without headache, syncope or seizure. Prior to admission,  she had been on Altace 10 milligrams q.d., Lasix 40 milligrams q.d.,  nitroglycerin 0.4 milligrams p.r.n., Imdur 30 milligrams q.d., and she  states aspirin though it is not listed. As mentioned, she quit smoking  cigarettes in November of 2005.   ALLERGIES:  She has no known history of allergies.   REVIEW OF SYSTEMS:  She denies any history of amaurosis fugax, double  vision, swallowing problems, blackout spells or seizures.   PHYSICAL EXAMINATION:  GENERAL:  At times of evaluation, examination  revealed a well-developed female in no acute distress.  VITAL SIGNS:  A blood pressure right and left arm 145/90, heart rate was 96.  She  is in normal sinus rhythm by telemetry. Temperature was 97.2,  respiratory rate was 19.  NEUROLOGICAL:  Mental status she is alert, oriented x3. There was no  aphasia. Cranial nerve examination revealed visual fields full, disks flat,  extraocular movements full. Corneals present. No seventh nerve palsy with  right pupil larger than the left. I thought she may have had a mild  dysarthria but her husband felt her speech was normal. Tongue was midline.  The uvula midline. Gags were present. Motor examination revealed 5/5  strength proximally and distally. Coordination testing was normal. Sensory  examination was intact pinprick, light touch, normal position, and vibration  testing. Deep tendon reflexes were 2+ and plantar responses were downgoing.   LABORATORY DATA:  Laboratory data revealed a CT scan of the brain without  contrast enhancement which was normal. CK-MB was 2.5. Troponin was less than  0.05. Cholesterol 131, HDL 44, LDL was 48. TSH was 4.296. White blood cell  count was 7900, hemoglobin was low at 9.3, hematocrit 27.9, platelet count  was 231,000.  The pro time was 13.4 with an INR of 1.0, PTT was 27. Sodium is  140, potassium 3.8, chloride 109, CO2 content 95, BUN 13.   ASSESSMENT:  Electrocardiogram showed sinus tachycardia and biatrial  enlargement.   IMPRESSION:  1.  Dysarthria most likely representing transient ischemic attack, code      435.9.  2.  History coronary artery disease, code 429.2.  3.  Hypertension, code 796.2.  4.  Cigarette use, code 496.  5.  Anemia most likely iron deficiency anemia.   PLAN:  Plan at this time is to be obtained MRI and MRA for further  evaluation.       JML/MEDQ  D:  08/22/2004  T:  08/22/2004  Job:  [id number]

## 2010-07-10 NOTE — Discharge Summary (Signed)
[name], [name]                 ACCOUNT NO.:  [id number]   MEDICAL RECORD NO.:  [id number]          PATIENT TYPE:  INP   LOCATION:  4705                         FACILITY:  [facility]   PHYSICIAN:  Mohan N. Sharyn Lull, M.D. DATE OF BIRTH:  Jan 21, 1956   DATE OF ADMISSION:  07/27/2007  DATE OF DISCHARGE:  07/29/2007                               DISCHARGE SUMMARY   ADMITTING DIAGNOSES:  1. 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  2. Uncontrolled hypertension.  3. Compensated systolic heart failure.  4. Coronary artery disease.  5. 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  6. History of cerebrovascular accident in the past.  7. Chronic anemia.  8. History of peptic ulcer disease.  9. Hypercholesteremia.  10.Positive family history of coronary artery disease.  11.Glucose intolerance.   DISCHARGE DIAGNOSES:  1. Resolving angioedema of face and lips secondary to angiotensin-      converting enzyme.  2. Hypertension.  3. Compensated systolic heart failure.  4. Coronary artery disease.  5. History of non-Q-wave myocardial infarction in the past.  6. History of cerebrovascular accident.  7. Chronic anemia.  8. History of peptic ulcer disease.  9. Hypercholesteremia.  10.Positive family history of coronary artery disease.  11.Glucose intolerance.   DISCHARGE HOME MEDICATIONS:  1. Coreg 25 mg 1 tablet every 12 hours.  2. Cozaar 50 mg 1 tablet every 12 hours.  3. Imdur 120 mg 1 tablet daily.  4. Clonidine 0.1 mg 1 tablet daily.  5. Lipitor 10 mg 1 tablet daily.  6. Prilosec 20 mg 1 capsule daily.  7. Aggrenox 25/200 one capsule twice daily.  8. Norvasc 5 mg 1 tablet daily.  9. Prednisone 20 mg today, then 10 mg daily for 2 days, then 5 mg      daily for 2 more days.   The patient has been advised not to take any ACE inhibitors.   DIET:  Low-salt, low-cholesterol, 1800-calories ADA diet.  The patient  has been advised to avoid  sweets.   ACTIVITY:  As tolerated.   FOLLOWUP:  Followup with me in 1 week.   CONDITION ON DISCHARGE:  Stable.   BRIEF HISTORY AND HOSPITAL COURSE:  Teresa [name] is a 55 year old black  female with past medical history significant for coronary artery  disease, status post non-Q-wave MI in the past, hypertension, history of  systolic heart failure, history of peptic ulcer disease, chronic anemia,  history of CVA, tobacco abuse, hypercholesteremia, and positive family  history of coronary artery disease recently discharged from the  hospital, came to the ER complaining of swelling of the left side face  since 5:00 p.m.  States initially noted swelling on the right side of  the lip and gradually increased to the left side.  Denies any tongue or  throat swelling.  Denies shortness of breath.  Denies eating anything  unusual.  Denies any difficulty swallowing.  The patient was seen in the  office this weekend and Altace was increased to 10 mg p.o. b.i.d.  Denies such episodes in the  past.  The patient received Solu-Medrol,  Benadryl, and Pepcid in the ER without much improvement.   PAST MEDICAL HISTORY:  As above.   PAST SURGICAL HISTORY:  She had tubal ligation in the past and throat  surgery in the past.   ALLERGIES:  She has angioedema with ACE.   SOCIAL HISTORY:  She is married, has 2 children.  Smoked half pack per  day for 20 years, quit in 2002.  Drinks beer occasionally socially.   FAMILY HISTORY:  Father died of accidental death.  Mother is alive.  She  has coronary artery disease, hypertension, and congestive heart failure.   MEDICATIONS AT HOME:  1. Ramipril 10 mg p.o. b.i.d.  2. Cozaar 50 mg p.o. daily.  3. Coreg 25 mg p.o. q.12 h.  4. Imdur 30 mg p.o. daily.  5. Lipitor 10 mg p.o. daily.  6. Prilosec 20 mg p.o. daily.  7. Aggrenox 25/200 p.o. b.i.d.   PHYSICAL EXAMINATION:  She was alert and oriented x3, in no acute  distress.  Blood pressure was 185/103 and pulse  was 88, regular.  Conjunctivae was pink.  Facial and lip swelling was present.  Tongue was  normal-sized.  Posterior pharynx was okay.  Neck was supple.  No  swelling.  No lymph nodes.  Lungs were clear to auscultation without  rhonchi or rales.  Cardiovascular exam, S1 and S2 was normal.  There was  soft systolic murmur.  There was no S3 or S4 gallop.  Abdomen was soft,  bowel sounds were present, and nontender.  Extremities, there is no  clubbing, cyanosis, or edema.   LABORATORY DATA:  Hemoglobin was 10.5, hematocrit 32, and white count of  10.8.  Sodium was 138, potassium 3.2, chloride 104, bicarb 23, glucose  150, BUN 10, and creatinine 0.74.  Repeat sodium was 139, potassium 4.1,  chloride 110, bicarb 24, glucose 139, BUN 8, and creatinine 0.80.   BRIEF HOSPITAL COURSE:  The patient was admitted to the telemetry unit.  The patient was started on IV Solu-Medrol, Benadryl, and Pepcid with  gradual improvement in her swelling.  The patient did not have any  episodes of rash or difficulty breathing.  The patient has been  ambulating in hallway without any problems.  The patient is off oxygen  and saturating well.  The patient will be discharged home on above  medications and will be followed up in office in 2 weeks.      Teresa [name]. Sharyn Lull, M.D.  Electronically Signed     MNH/MEDQ  D:  09/07/2007  T:  09/07/2007  Job:  [id number]

## 2010-07-10 NOTE — Cardiovascular Report (Signed)
[name], [name]                 ACCOUNT NO.:  [id number]   MEDICAL RECORD NO.:  [id number]          PATIENT TYPE:  INP   LOCATION:  2904                         FACILITY:  [facility]   PHYSICIAN:  Mohan N. Sharyn Lull, M.D. DATE OF BIRTH:  [birth year]   DATE OF PROCEDURE:  01/22/2004  DATE OF DISCHARGE:                              CARDIAC CATHETERIZATION   PROCEDURE:  1.  Left cardiac catheterization.  2.  Selective left and right coronary angiography.  3.  Left ventriculography via right groin using Judkins technique.   INDICATION FOR THE PROCEDURE:  [name] is a 55 year old black female  with past medical history significant for hypertension, tobacco abuse,  chronic menorrhagia, history of chronic hypochromic microcytic anemia,  positive family history of coronary artery disease.  She came to the ER  complaining of retrosternal chest pain described as pressure, tightness  grade 9/10, localized.  Increased with deep breathing and laying down since  8 p.m. last night.  Associated with mild shortness of breath.  Patient  denies any nausea, vomiting, diaphoresis.  Denies palpitation,  lightheadedness, or syncope.  Denies PND, orthopnea, leg swelling.  Patient  does give history of exertional chest pain and dyspnea and feeling weak and  tired for the last few months.  Patient states she stopped her blood  pressure medication approximately three to four months ago.  Also complains  of chronic menorrhagia lasting six days of menstrual cycle every month with  large amount of blood clots.  Denies any OB/GYN work-up.  Denies any  abdominal pain, nausea, vomiting.  Denies bright red blood per rectum.  Denies any chest pain or problem breathing at present.  EKG done in the ER  showed normal sinus rhythm with left atrial enlargement, LVH with T-wave  inversion in the lateral leads, and was noted also to have elevated troponin  I of 1.14.  Second set was 4.10, 4.39, 3.23, and 3.46 although her  CPKs four  sets were normal.  Due to atypical chest pain, elevated troponin I, multiple  risk factors, discussed with patient regarding left catheterization,  possible PTCA and stenting, its risks, i.e., death, MI, stroke, need for  emergency CABG, risk of restenosis, local vascular complications, etc. and  consented for PCI.   PROCEDURE:  After obtaining the informed consent patient was brought to the  catheterization laboratory and was placed on fluoroscopy table.  Right groin  was prepped and draped in usual fashion.  2% Xylocaine was used for local  anesthesia in the right groin.  With the help of thin wall needle 6-French  arterial sheath was placed.  The sheath was aspirated and flushed.  Next, 6-  French left Judkins catheter was advanced over the wire under fluoroscopic  guidance up to the ascending aorta where it was pulled out.  The catheter  was aspirated and connected to the manifold.  Catheter was further advanced  and engaged into left coronary ostium.  Multiple views of the left system  were taken.  Next, the catheter was disengaged and was pulled out over the  wire and was replaced  with 6-French right Judkins catheter which was  advanced over the wire under fluoroscopic guidance up to the ascending aorta  where it was pulled out.  The catheter was aspirated and connected to the  manifold.  Catheter was further advanced and engaged into right coronary  ostium.  Multiple views of the right system were taken.  Next, the catheter  was disengaged and was pulled out over the wire and was replaced with 6-  French pigtail catheter which was advanced over the wire under fluoroscopic  guidance up to the ascending aorta where it was pulled out.  The catheter  was aspirated and connected to the manifold.  Catheter was further advanced  across the aortic valve into the LV.  LV pressures were recorded.  Next, LV  graph was done in 30 degree RAO position.  Post angiographic pressures  were  recorded from LV and then pullback pressures recorded from aorta.  There was  no gradient across the aortic valve.  Next, the pigtail catheter was pulled  out over the wire.  Sheaths were aspirated and flushed.   FINDINGS:  LV showed inferior wall severe hypokinesia with moderate global  hypokinesia EF of 35-40%.  Left main was short which was patent.  LAD has 10-  15% proximal stenosis.  Diagonal 1 is less than 0.5 mm which is very small  vessel.  Diagonal 2 has 30-40% ostial stenosis.  Diagonal 3 is also very  small which is less than 0.5 mm.  Left circumflex is large which is patent  proximally which tapers down in AV groove which is patent.  OM1 and OM2 were  less than 1 mm.  OM3 is large which is patent which bifurcates into inferior  and superior branch.  Both these branches are also patent.  RCA is patent.  Patient tolerated procedure well.  There were no complications.  Patient was  transferred to recovery room in stable condition.       MNH/MEDQ  D:  01/22/2004  T:  01/22/2004  Job:  [id number]   cc:   Cath Lab

## 2010-10-26 ENCOUNTER — Emergency Department (HOSPITAL_COMMUNITY)
Admission: EM | Admit: 2010-10-26 | Discharge: 2010-10-27 | Disposition: A | Discharge status: Home / Self Care | Payer: BC Managed Care – PPO | Attending: Emergency Medicine | Admitting: Emergency Medicine

## 2010-10-26 DIAGNOSIS — M25569 Pain in unspecified knee: Secondary | ICD-10-CM | POA: Insufficient documentation

## 2010-10-26 DIAGNOSIS — I252 Old myocardial infarction: Secondary | ICD-10-CM | POA: Insufficient documentation

## 2010-10-26 DIAGNOSIS — IMO0002 Reserved for concepts with insufficient information to code with codable children: Secondary | ICD-10-CM | POA: Insufficient documentation

## 2010-10-26 DIAGNOSIS — J45909 Unspecified asthma, uncomplicated: Secondary | ICD-10-CM | POA: Insufficient documentation

## 2010-10-26 DIAGNOSIS — Z8679 Personal history of other diseases of the circulatory system: Secondary | ICD-10-CM | POA: Insufficient documentation

## 2010-10-26 DIAGNOSIS — R296 Repeated falls: Secondary | ICD-10-CM | POA: Insufficient documentation

## 2010-10-26 DIAGNOSIS — I10 Essential (primary) hypertension: Secondary | ICD-10-CM | POA: Insufficient documentation

## 2010-10-27 ENCOUNTER — Emergency Department (HOSPITAL_COMMUNITY): Payer: BC Managed Care – PPO

## 2010-11-18 LAB — DIFFERENTIAL
Basophils Relative: 1
Eosinophils Absolute: 0.4
Lymphs Abs: 1.8
Monocytes Absolute: 0.6
Monocytes Relative: 7
Neutrophils Relative %: 66

## 2010-11-18 LAB — POCT I-STAT, CHEM 8
Chloride: 107
Creatinine, Ser: 0.8
Glucose, Bld: 100 — ABNORMAL HIGH
HCT: 31 — ABNORMAL LOW
Hemoglobin: 10.5 — ABNORMAL LOW
Potassium: 3.7
Sodium: 137

## 2010-11-18 LAB — RETICULOCYTES
RBC.: 3.67 — ABNORMAL LOW
Retic Count, Absolute: 44
Retic Ct Pct: 1.2

## 2010-11-18 LAB — LIPID PANEL
Cholesterol: 130
Total CHOL/HDL Ratio: 3.3

## 2010-11-18 LAB — C-REACTIVE PROTEIN: CRP: 1.5 — ABNORMAL HIGH (ref <0.6)

## 2010-11-18 LAB — BASIC METABOLIC PANEL
CO2: 22
Chloride: 107
GFR calc non Af Amer: >60
Glucose, Bld: 98
Potassium: 3.3 — ABNORMAL LOW
Sodium: 139

## 2010-11-18 LAB — CK TOTAL AND CKMB (NOT AT ARMC)
CK, MB: 1.9
Relative Index: 1.3

## 2010-11-18 LAB — COMPREHENSIVE METABOLIC PANEL
AST: 25
Albumin: 3.7
BUN: 10
Creatinine, Ser: 0.7
GFR calc Af Amer: >60
Potassium: 3.5
Total Protein: 7.1

## 2010-11-18 LAB — HEPARIN LEVEL (UNFRACTIONATED): Heparin Unfractionated: 0.63

## 2010-11-18 LAB — CBC
HCT: 28.9 — ABNORMAL LOW
Hemoglobin: 10 — ABNORMAL LOW
MCHC: 32.9
MCHC: 34.6
MCV: 78
Platelets: 198
RBC: 3.71 — ABNORMAL LOW
RDW: 18 — ABNORMAL HIGH
WBC: 8.6

## 2010-11-18 LAB — CARDIAC PANEL(CRET KIN+CKTOT+MB+TROPI)
CK, MB: 1.3
CK, MB: 1.4
Relative Index: 1.3
Total CK: 123
Troponin I: 0.06
Troponin I: 0.07 — ABNORMAL HIGH

## 2010-11-18 LAB — IRON AND TIBC: UIBC: 374

## 2010-11-18 LAB — APTT: aPTT: 25

## 2010-11-18 LAB — POCT CARDIAC MARKERS: Myoglobin, poc: 37.4

## 2010-11-18 LAB — PROTIME-INR: INR: 1

## 2010-11-18 LAB — FERRITIN: Ferritin: 10 (ref 10–291)

## 2010-11-19 LAB — DIFFERENTIAL
Lymphocytes Relative: 11 — ABNORMAL LOW
Lymphs Abs: 1.2
Neutrophils Relative %: 86 — ABNORMAL HIGH

## 2010-11-19 LAB — COMPREHENSIVE METABOLIC PANEL
CO2: 23
Calcium: 9.2
Creatinine, Ser: 0.74
GFR calc Af Amer: >60
GFR calc non Af Amer: >60
Glucose, Bld: 150 — ABNORMAL HIGH

## 2010-11-19 LAB — PROTIME-INR: INR: 0.9

## 2010-11-19 LAB — BASIC METABOLIC PANEL
BUN: 8
Chloride: 110
Glucose, Bld: 139 — ABNORMAL HIGH
Potassium: 4.1

## 2010-11-19 LAB — CBC
Hemoglobin: 10.5 — ABNORMAL LOW
MCHC: 32.8
MCV: 78.8
RBC: 4.07

## 2010-12-03 LAB — CBC
HCT: 25.5 — ABNORMAL LOW
Hemoglobin: 8.3 — ABNORMAL LOW
MCHC: 32.4
MCV: 73.3 — ABNORMAL LOW
RBC: 3.48 — ABNORMAL LOW

## 2010-12-03 LAB — LIPASE, BLOOD: Lipase: 19

## 2010-12-03 LAB — DIFFERENTIAL
Basophils Relative: 1
Eosinophils Absolute: 0.4
Monocytes Absolute: 0.9 — ABNORMAL HIGH
Monocytes Relative: 9

## 2010-12-03 LAB — HEPATIC FUNCTION PANEL
AST: 20
Bilirubin, Direct: <0.1
Total Bilirubin: 0.6

## 2010-12-03 LAB — I-STAT 8, (EC8 V) (CONVERTED LAB)
Glucose, Bld: 97
Potassium: 2.9 — ABNORMAL LOW
TCO2: 25
pH, Ven: 7.463 — ABNORMAL HIGH

## 2010-12-03 LAB — POCT CARDIAC MARKERS
CKMB, poc: 1.3
Operator id: 151321
Troponin i, poc: <0.05

## 2010-12-03 LAB — POCT I-STAT CREATININE: Operator id: 151321

## 2011-03-11 IMAGING — CT CT NECK W/ CM
3 series · 15 of 33 positions shown, 18 images · IV contrast (80ml omni 300)
Comparison: Neck radiographs performed 06/09/2009

CLINICAL DATA: Right-sided neck swelling and pain, with sensation
of something stuck in throat.  Assess for abscess.

CT NECK WITH CONTRAST
TECHNIQUE: Multidetector CT imaging of the neck was performed with
intravenous contrast.
Contrast: 80 mL of Omnipaque 300 IV contrast

[Series 2: 2cc/30ml and 1cc/45ml · axial · 0.47mm/px · z∈[-301,-138]mm · 7 of 77 slices shown, 9 images]
[im 6/77  soft-tissue]
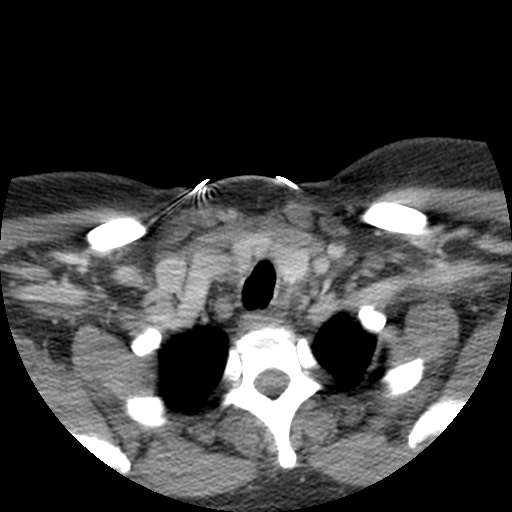
[im 6/77  bone]
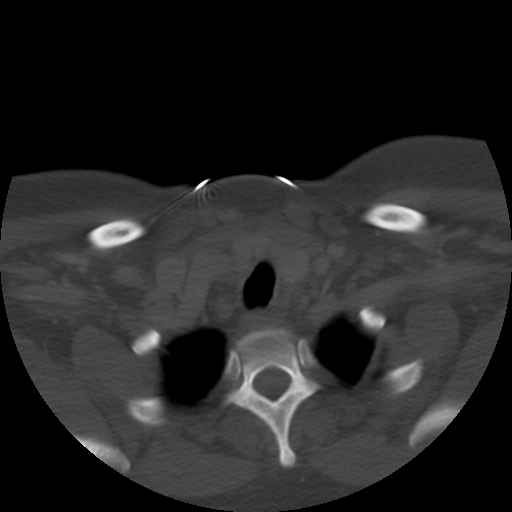
[im 18/77  bone]
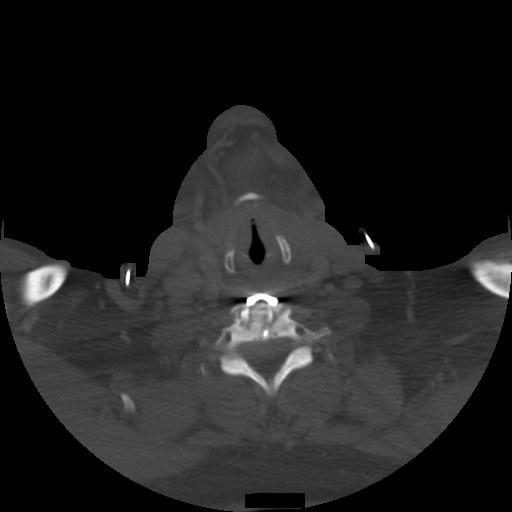
[im 30/77  bone]
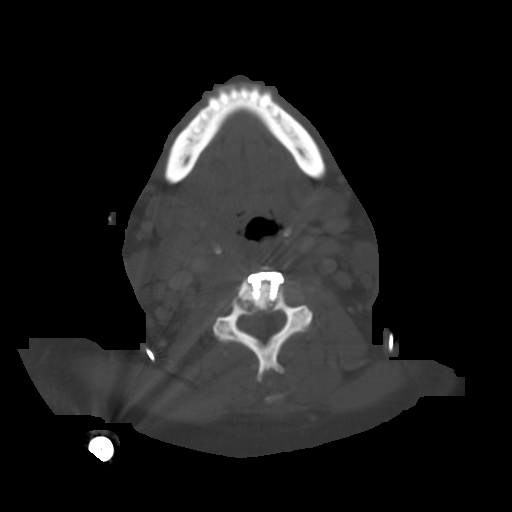
[im 41/77  bone]
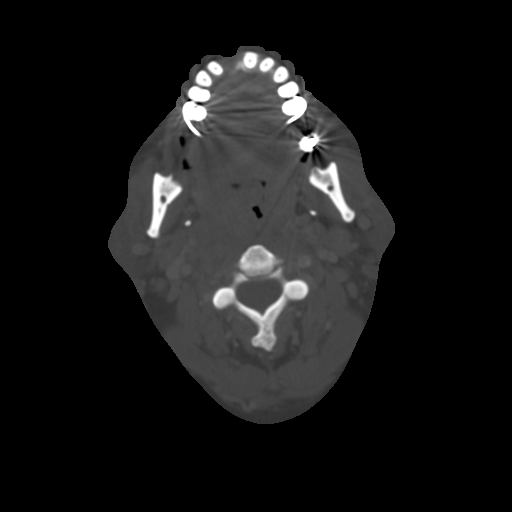
[im 47/77  soft-tissue]
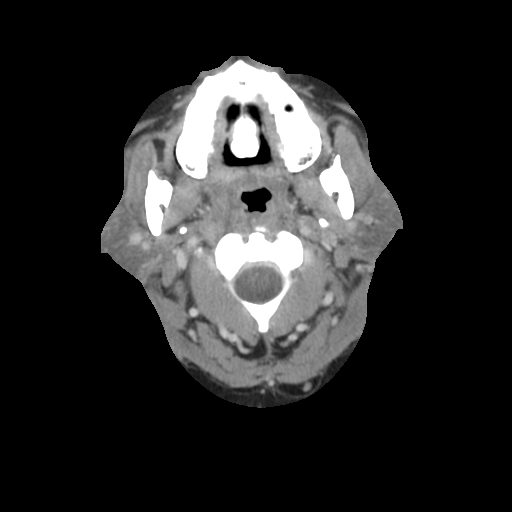
[im 47/77  bone]
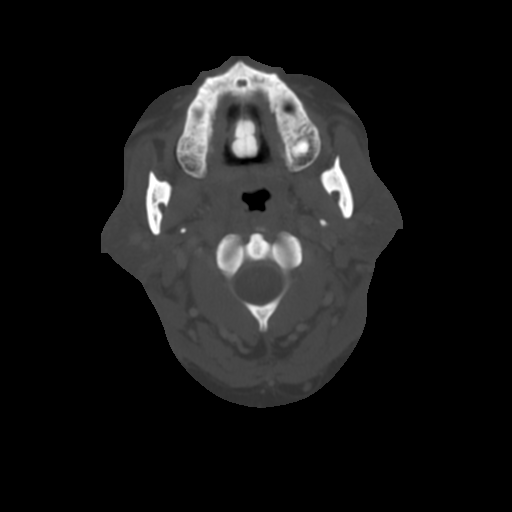
[im 59/77  bone]
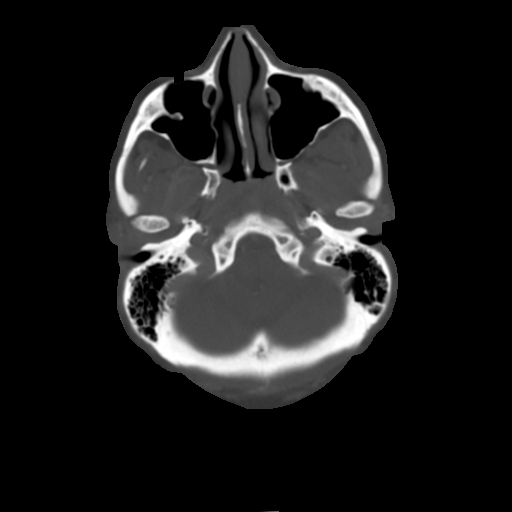
[im 71/77  bone]
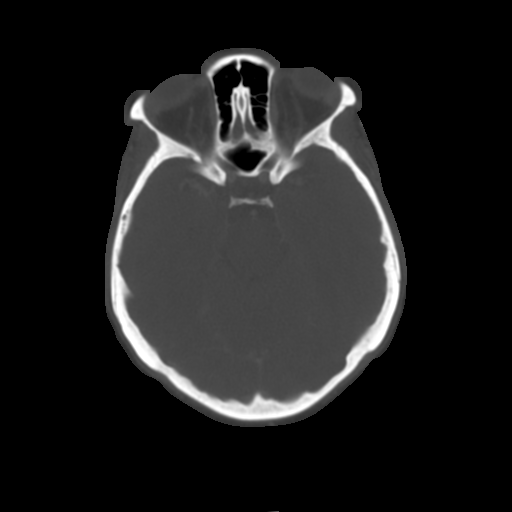

[Series 300: reformatted · coronal · 0.47mm/px · 3 of 110 slices shown (1 of 2)]
[im 22/110  bone]
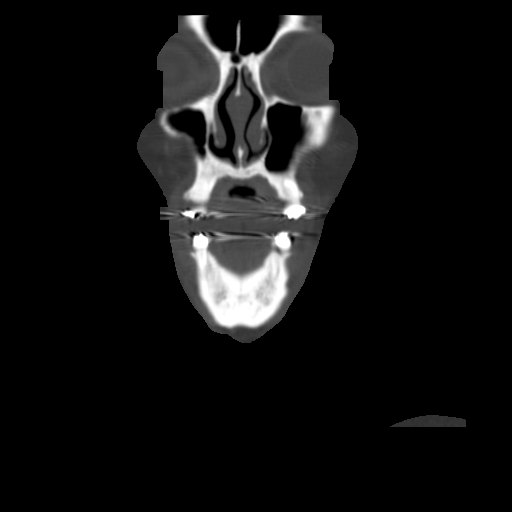
[im 44/110  bone]
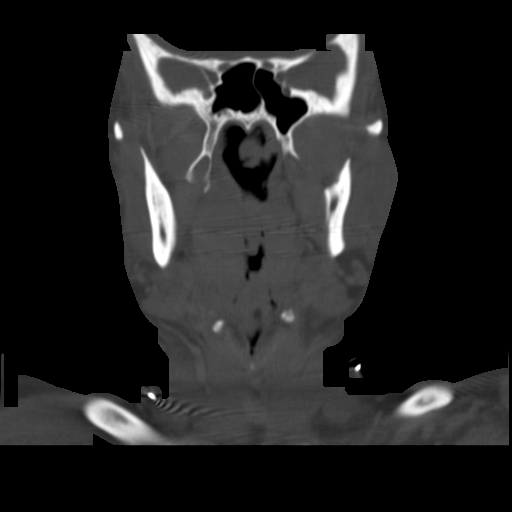
[im 66/110  bone]
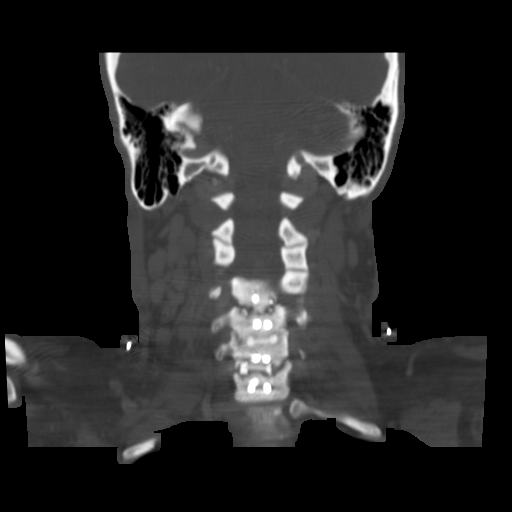

[Series 301: reformatted · sagittal · 0.47mm/px · 5 of 91 slices shown, 6 images (2 of 2)]
[im 31/91  bone]
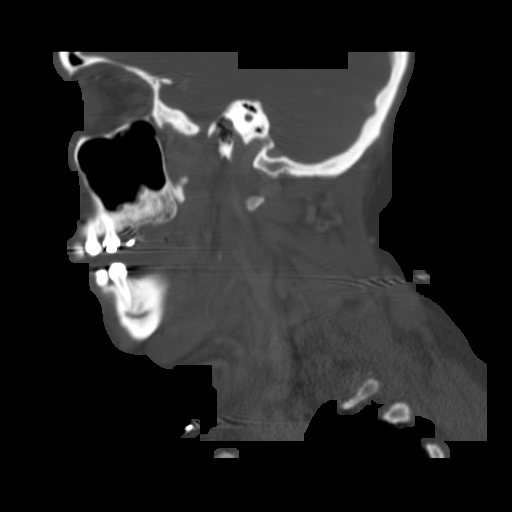
[im 38/91  bone]
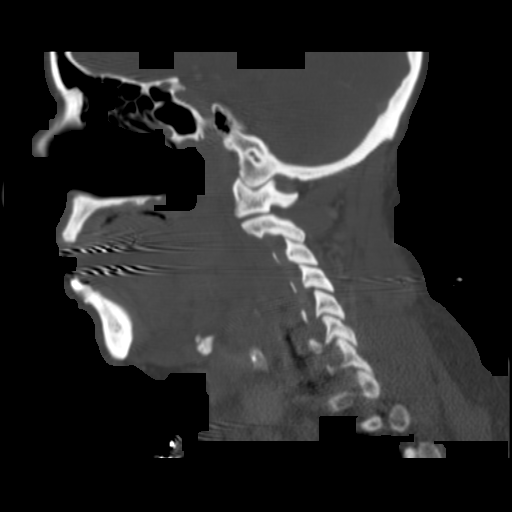
[im 46/91  soft-tissue]
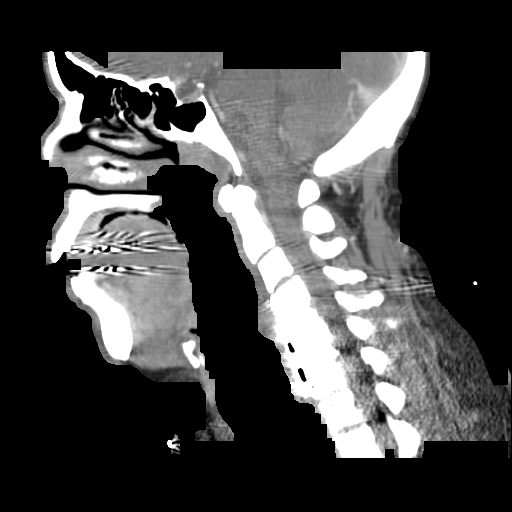
[im 46/91  bone]
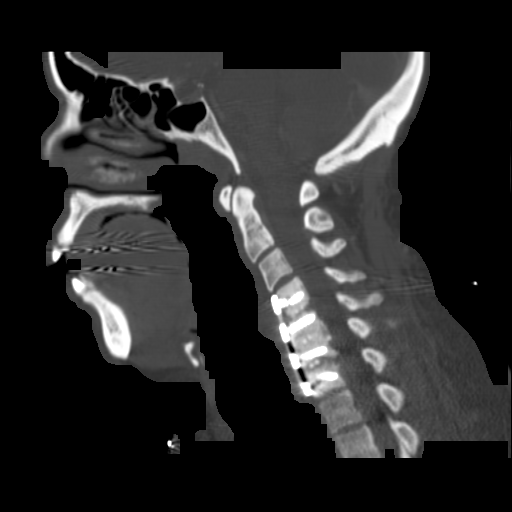
[im 53/91  bone]
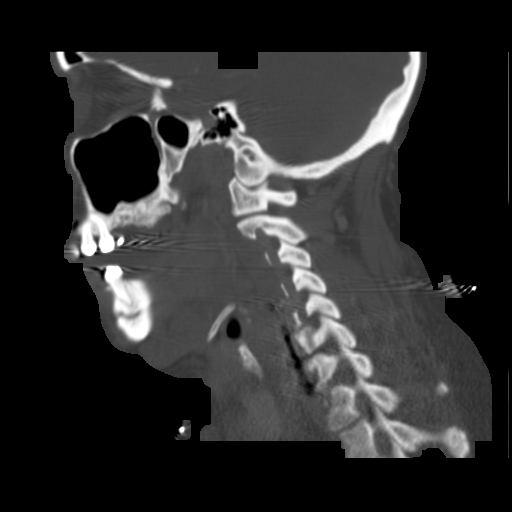
[im 61/91  bone]
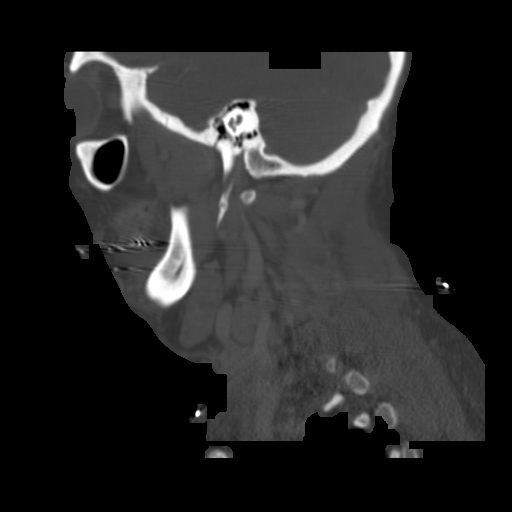

[15 of 33 positions shown; findings below may reference images not displayed]

FINDINGS: There is significant tonsillar hypertrophy, right greater
than left, with partial effacement of the oropharynx and
hypopharynx.  There is associated significant thickening of the
prevertebral soft tissues.  There is partial effacement of the
right parapharyngeal fat planes.  No definite abscess is
identified.

A trace amount of fluid is suggested within the right palatine
tonsil, measuring up to 2 mm, but this likely reflects a large
phlegmon; no drainable fluid collection is seen.  Given the degree
of edema, airway compromise is a concern.

Multiple enlarged lymph nodes are noted bilaterally, right greater
than left, measuring up to 1.4 cm in short axis on the right side.
There is no evidence of vascular compromise.  The parotid glands
are unremarkable in appearance; the submandibular lungs are within
normal limits.

The orbits are within normal limits.  The visualized portions of
the brain are unremarkable.  The paranasal sinuses and mastoid air
cells are well-aerated.

No additional soft tissue abnormalities are seen.  The visualized
lung apices are clear.

There is a fracture of the right screw of the anterior cervical
spinal fusion hardware at C4.  The remaining screws of the anterior
cervical spinal fusion remain intact.  No acute osseous
abnormalities are identified.
IMPRESSION: 1.  Significant tonsillar hypertrophy, right greater than left,
with partial effacement of the oropharynx and hypopharynx, and
thickening of the prevertebral soft tissues.  No drainable abscess
seen; associated phlegmon noted.  Given the degree of edema, airway
compromise is a concern.
2.  Cervical lymphadenopathy, right greater than left.
3.  Fracture of the right screw of the anterior cervical spinal
fusion hardware at C4; the remaining screws of the anterior
cervical spinal fusion remain intact.

## 2012-03-16 ENCOUNTER — Other Ambulatory Visit: Payer: Self-pay | Admitting: Orthopedic Surgery

## 2012-03-17 ENCOUNTER — Encounter: Payer: Self-pay | Admitting: Family Medicine

## 2012-03-17 ENCOUNTER — Other Ambulatory Visit (HOSPITAL_COMMUNITY): Payer: Self-pay | Admitting: Cardiology

## 2012-03-17 ENCOUNTER — Ambulatory Visit (INDEPENDENT_AMBULATORY_CARE_PROVIDER_SITE_OTHER): Payer: BC Managed Care – PPO | Admitting: Family Medicine

## 2012-03-17 VITALS — BP 162/98 | HR 81 | Ht 67.0 in

## 2012-03-17 DIAGNOSIS — M1710 Unilateral primary osteoarthritis, unspecified knee: Secondary | ICD-10-CM | POA: Insufficient documentation

## 2012-03-17 DIAGNOSIS — D649 Anemia, unspecified: Secondary | ICD-10-CM | POA: Insufficient documentation

## 2012-03-17 DIAGNOSIS — R079 Chest pain, unspecified: Secondary | ICD-10-CM

## 2012-03-17 DIAGNOSIS — I2510 Atherosclerotic heart disease of native coronary artery without angina pectoris: Secondary | ICD-10-CM | POA: Insufficient documentation

## 2012-03-17 DIAGNOSIS — E785 Hyperlipidemia, unspecified: Secondary | ICD-10-CM

## 2012-03-17 DIAGNOSIS — I10 Essential (primary) hypertension: Secondary | ICD-10-CM

## 2012-03-17 DIAGNOSIS — M179 Osteoarthritis of knee, unspecified: Secondary | ICD-10-CM | POA: Insufficient documentation

## 2012-03-17 LAB — COMPREHENSIVE METABOLIC PANEL
ALT: 16 U/L (ref 0–35)
AST: 20 U/L (ref 0–37)
Alkaline Phosphatase: 84 U/L (ref 39–117)
Calcium: 9.5 mg/dL (ref 8.4–10.5)
Chloride: 103 mEq/L (ref 96–112)
Creat: 0.77 mg/dL (ref 0.50–1.10)
Total Bilirubin: 0.7 mg/dL (ref 0.3–1.2)

## 2012-03-17 LAB — CBC
HCT: 39.1 % (ref 36.0–46.0)
MCHC: 34.3 g/dL (ref 30.0–36.0)
RDW: 16 % — ABNORMAL HIGH (ref 11.5–15.5)

## 2012-03-17 LAB — LDL CHOLESTEROL, DIRECT: Direct LDL: 108 mg/dL — ABNORMAL HIGH

## 2012-03-17 MED ORDER — CARVEDILOL 12.5 MG PO TABS: 12.5000 mg | ORAL_TABLET | Freq: Two times a day (BID) | ORAL | Status: DC

## 2012-03-17 MED ORDER — AMLODIPINE BESYLATE 10 MG PO TABS: 10.0000 mg | ORAL_TABLET | Freq: Every day | ORAL | Status: DC

## 2012-03-17 MED ORDER — ATORVASTATIN CALCIUM 10 MG PO TABS: 10.0000 mg | ORAL_TABLET | Freq: Every day | ORAL | Status: DC

## 2012-03-17 NOTE — Assessment & Plan Note (Signed)
Will obtain direct LDL, and CMP. We'll start low-dose Lipitor as it was her previous statin.

## 2012-03-17 NOTE — Assessment & Plan Note (Signed)
Uncontrolled but asymptomatic.  Previously controlled with 1) angiotensin receptor blocker 2) amlodipine 3) Coreg 4) Imdur 5) clonidine  Plan:  Start medium dose Coreg, full dose amlodipine.  Patient will followup with Dr Sharyn Lull this afternoon. Followup with Dr. Jennette Kettle in a few weeks

## 2012-03-17 NOTE — Progress Notes (Signed)
Teresa [name] is a 57 y.o. female who presents to [facility] today for blood pressure evaluation prior to total knee replacement. Patient was recently seen at Dr. Luiz Blare' office.  She was found to have elevated blood pressure and was asked to followup here for blood pressure control prior to her elective knee surgery.   On review patient has a history of myocardial infarction, and CVA in the past previously controlled by Dr. Sharyn Lull.  She in the interim has stopped going to Dr Sharyn Lull and stopped taking her blood pressure medications.   She denies any chest pains palpitations or shortness of breath. She feels well otherwise.    PMH reviewed.  1. 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 2. Uncontrolled hypertension.  3. Compensated systolic heart failure.  4. Coronary artery disease.  5. 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  6. History of cerebrovascular accident in the past.  7. Chronic anemia.  8. History of peptic ulcer disease.  9. Hypercholesteremia.  10.Positive family history of coronary artery disease.  11.Glucose intolerance.  History  Substance Use Topics  . Smoking status: Former Smoker    Quit date: 02/23/2000  . Smokeless tobacco: Never Used  . Alcohol Use: Not on file  Medications: NONE currently ROS as above otherwise neg   Exam:  BP 162/98  Pulse 81  Ht 5\' 7"  (1.702 m) Gen: Well NAD HEENT: Moist mucous membrane Lung: Clear to auscultation bilaterally normal work of breathing Heart: Regular rate and rhythm no murmurs rubs or gallop Extremity: Non-edematous warm and well perfused Pulses: 2+ and intact bilateral upper extremity  Twelve-lead EKG shows normal sinus rhythm at 73 beats per minute. She has a prolonged QT segment at 434. Additionally she has inverted T waves in the lateral precordial leads.  No Q-wave.

## 2012-03-17 NOTE — Patient Instructions (Addendum)
Thank you for coming in today. We will try as hard as we can to get you ready for your total knee in February.  We are starting a medium dose of coreg, and Norvasc and Lipitor.  I sent those to the pharmacy.  We are also getting  1) Direct LDL 2) CMP 3) CBC labs Follow up with Dr Jennette Kettle.   Bonita Quin have been scheduled to see Dr. Sharyn Lull this afternoon at 1:45pm.  Their phone number is [phone]

## 2012-03-17 NOTE — Assessment & Plan Note (Signed)
History of MI, and CVA.  Currently not on any medications.  Previous antiplatelets with Aggrenox.  Plan: Patient will followup with Dr Sharyn Lull this afternoon to discuss antiplatelet therapy.  Additionally she will likely need a nuclear stress test prior to elective knee replacement surgery for risk factor stratification. She is currently not cleared for surgery.  Will defer clearance to Dr Sharyn Lull

## 2012-03-17 NOTE — Progress Notes (Signed)
[facility]: Attending Note: I have reviewed the chart, discussed wit the Sports Medicine Fellow. I agree with assessment and treatment plan as detailed in the Fellow's note. I will be acceptng her as a patient into my continuity practice at [facility] and she will follow up with me there in next 1 month. We have notified her orthopedist re her clearance issues .

## 2012-03-21 ENCOUNTER — Other Ambulatory Visit: Payer: Self-pay

## 2012-03-21 ENCOUNTER — Encounter (HOSPITAL_COMMUNITY)
Admission: RE | Admit: 2012-03-21 | Discharge: 2012-03-21 | Disposition: A | Discharge status: Home / Self Care | Payer: BC Managed Care – PPO | Source: Ambulatory Visit | Attending: Cardiology | Admitting: Cardiology

## 2012-03-21 DIAGNOSIS — R079 Chest pain, unspecified: Secondary | ICD-10-CM

## 2012-03-21 DIAGNOSIS — I10 Essential (primary) hypertension: Secondary | ICD-10-CM | POA: Insufficient documentation

## 2012-03-21 MED ORDER — REGADENOSON 0.4 MG/5ML IV SOLN
0.4000 mg | Freq: Once | INTRAVENOUS | Status: AC
  Administered 2012-03-21: 0.4 mg via INTRAVENOUS

## 2012-03-21 MED ORDER — REGADENOSON 0.4 MG/5ML IV SOLN
INTRAVENOUS | Status: AC
  Filled 2012-03-21: qty 5

## 2012-03-21 MED ORDER — TECHNETIUM TC 99M SESTAMIBI GENERIC - CARDIOLITE: 30.0000 | Freq: Once | INTRAVENOUS | Status: DC | PRN

## 2012-03-21 MED ORDER — TECHNETIUM TC 99M SESTAMIBI GENERIC - CARDIOLITE
30.0000 | Freq: Once | INTRAVENOUS | Status: AC | PRN
  Administered 2012-03-21: 30 via INTRAVENOUS

## 2012-03-21 MED ORDER — TECHNETIUM TC 99M SESTAMIBI GENERIC - CARDIOLITE
10.0000 | Freq: Once | INTRAVENOUS | Status: AC | PRN
  Administered 2012-03-21: 10 via INTRAVENOUS

## 2012-03-22 ENCOUNTER — Encounter (HOSPITAL_COMMUNITY): Payer: Self-pay | Admitting: Pharmacy Technician

## 2012-03-22 ENCOUNTER — Ambulatory Visit: Payer: BC Managed Care – PPO | Admitting: Family Medicine

## 2012-03-23 ENCOUNTER — Other Ambulatory Visit: Payer: Self-pay | Admitting: Orthopedic Surgery

## 2012-03-24 ENCOUNTER — Encounter (HOSPITAL_COMMUNITY): Payer: Self-pay

## 2012-03-24 ENCOUNTER — Ambulatory Visit (HOSPITAL_COMMUNITY)
Admission: RE | Admit: 2012-03-24 | Discharge: 2012-03-24 | Disposition: A | Discharge status: Home / Self Care | Payer: BC Managed Care – PPO | Source: Ambulatory Visit | Attending: Orthopedic Surgery | Admitting: Orthopedic Surgery

## 2012-03-24 ENCOUNTER — Other Ambulatory Visit: Payer: Self-pay | Admitting: Orthopedic Surgery

## 2012-03-24 ENCOUNTER — Encounter (HOSPITAL_COMMUNITY)
Admission: RE | Admit: 2012-03-24 | Discharge: 2012-03-24 | Disposition: A | Discharge status: Home / Self Care | Payer: BC Managed Care – PPO | Source: Ambulatory Visit | Attending: Orthopedic Surgery | Admitting: Orthopedic Surgery

## 2012-03-24 DIAGNOSIS — R9431 Abnormal electrocardiogram [ECG] [EKG]: Secondary | ICD-10-CM | POA: Insufficient documentation

## 2012-03-24 DIAGNOSIS — I252 Old myocardial infarction: Secondary | ICD-10-CM | POA: Insufficient documentation

## 2012-03-24 DIAGNOSIS — Z8673 Personal history of transient ischemic attack (TIA), and cerebral infarction without residual deficits: Secondary | ICD-10-CM | POA: Insufficient documentation

## 2012-03-24 DIAGNOSIS — Z01810 Encounter for preprocedural cardiovascular examination: Secondary | ICD-10-CM | POA: Insufficient documentation

## 2012-03-24 DIAGNOSIS — Z01818 Encounter for other preprocedural examination: Secondary | ICD-10-CM | POA: Insufficient documentation

## 2012-03-24 DIAGNOSIS — Z01812 Encounter for preprocedural laboratory examination: Secondary | ICD-10-CM | POA: Insufficient documentation

## 2012-03-24 DIAGNOSIS — I10 Essential (primary) hypertension: Secondary | ICD-10-CM | POA: Insufficient documentation

## 2012-03-24 DIAGNOSIS — Z87891 Personal history of nicotine dependence: Secondary | ICD-10-CM | POA: Insufficient documentation

## 2012-03-24 HISTORY — DX: Unspecified osteoarthritis, unspecified site: M19.90

## 2012-03-24 HISTORY — DX: Bronchitis, not specified as acute or chronic: J40

## 2012-03-24 HISTORY — DX: Cerebral infarction, unspecified: I63.9

## 2012-03-24 LAB — CBC WITH DIFFERENTIAL/PLATELET
Eosinophils Absolute: 0.1 10*3/uL (ref 0.0–0.7)
Eosinophils Relative: 2 % (ref 0–5)
HCT: 39.9 % (ref 36.0–46.0)
Lymphs Abs: 2.3 10*3/uL (ref 0.7–4.0)
MCH: 29 pg (ref 26.0–34.0)
MCV: 84.4 fL (ref 78.0–100.0)
Monocytes Absolute: 0.6 10*3/uL (ref 0.1–1.0)
Monocytes Relative: 8 % (ref 3–12)
Platelets: 245 10*3/uL (ref 150–400)
RBC: 4.73 MIL/uL (ref 3.87–5.11)

## 2012-03-24 LAB — COMPREHENSIVE METABOLIC PANEL
BUN: 10 mg/dL (ref 6–23)
CO2: 28 mEq/L (ref 19–32)
Calcium: 9.7 mg/dL (ref 8.4–10.5)
Creatinine, Ser: 0.74 mg/dL (ref 0.50–1.10)
GFR calc Af Amer: >90 mL/min (ref >90)
GFR calc non Af Amer: >90 mL/min (ref >90)
Glucose, Bld: 86 mg/dL (ref 70–99)
Sodium: 143 mEq/L (ref 135–145)
Total Protein: 7.8 g/dL (ref 6.0–8.3)

## 2012-03-24 LAB — TYPE AND SCREEN
ABO/RH(D): A POS
Antibody Screen: NEGATIVE

## 2012-03-24 LAB — SURGICAL PCR SCREEN: MRSA, PCR: NEGATIVE

## 2012-03-24 LAB — URINALYSIS, ROUTINE W REFLEX MICROSCOPIC
Bilirubin Urine: NEGATIVE
Glucose, UA: NEGATIVE mg/dL
Hgb urine dipstick: NEGATIVE
Protein, ur: NEGATIVE mg/dL
Urobilinogen, UA: 0.2 mg/dL (ref 0.0–1.0)

## 2012-03-24 LAB — PROTIME-INR
INR: 0.97 (ref 0.00–1.49)
Prothrombin Time: 12.8 seconds (ref 11.6–15.2)

## 2012-03-24 NOTE — Pre-Procedure Instructions (Signed)
Teresa [name]  03/24/2012   Your procedure is scheduled on:  Friday March 31, 2012  Report to [facility] at 10:30 AM.  Call this number if you have problems the morning of surgery: [phone]   Remember:   Do not eat food or drink liquids after midnight.   Take these medicines the morning of surgery with A SIP OF WATER: amlodipine, carvedilol   Do not wear jewelry, make-up or nail polish.  Do not wear lotions, powders, or perfumes.  Do not shave 48 hours prior to surgery.  Do not bring valuables to the hospital.  Contacts, dentures or bridgework may not be worn into surgery.  Leave suitcase in the car. After surgery it may be brought to your room.     Patients discharged the day of surgery will not be allowed to drive home.  Name and phone number of your driver: family / friend  Special Instructions: Shower using CHG 2 nights before surgery and the night before surgery.  If you shower the day of surgery use CHG.  Use special wash - you have one bottle of CHG for all showers.  You should use approximately 1/3 of the bottle for each shower.   Please read over the following fact sheets that you were given: Pain Booklet, Coughing and Deep Breathing, Blood Transfusion Information, Total Joint Packet, MRSA Information and Surgical Site Infection Prevention

## 2012-03-27 NOTE — Consult Note (Signed)
Anesthesia Chart Review:  Patient is a 57 year old female scheduled for right TKA by Dr. Luiz Blare on 03/31/12.  History includes former smoker, MI '05, HTN, HLD, CVA, angioedema, arthritis, neck surgery.  PCP is now Dr. Huntley Dec Neal--recently established after referral  from Dr. Luiz Blare for HTN noted during his initial evaluation.  Due to her history of MI, she was also referred back to her Cardiologist Dr. Sharyn Lull and has since had a stress test showing no ischemia.  (EF 52%, but up from 2006.)  EKG on 03/24/12 showed NSR, possible anterior infarct (age undetermined).  Lateral T wave abnormality, consider ischemia.   Nuclear stress test on 03/21/12 showed: 1. Negative for pharmacologic-stress induced ischemia.  2. Left ventricular ejection fraction 52%.  Cardiac cath on 07/01/05 showed: LV showed inferior wall hypokinesia, EF of approximately 50%.  Left main was short which was patent. LAD has 20-25% proximal stenosis. Diagonal one is very, very small. Diagonal two has 20-30% ostial and proximal stenosis. Diagonal three is very, very small. Left circumflex is large which is patent which tapers down after giving off OM-3 in AV groove. OM-1 and OM-2 were very, very small which were less than 1 mm. OM-3 is large which is patent, which bifurcates into superior and inferior branch. Both these branches were also patent. RCA has 10-15% mild stenosis in the mid portion.  Echo from 08/24/04 showed moderately decreased LV systolic function, EF 30-40%.  Mildly reduced RV systolic function. Mild MR, trivial TR.    CXR report on 03/24/12 showed: Stable exam. Heart and mediastinal contours are within normal limits and stable. The lung fields demonstrate stable mild increase in central peribronchial cuffing compatible with underlying bronchitic  change and history of smoking. No new focal infiltrates or signs  of congestive failure are seen. No pleural fluid is noted. Bony structures appear intact with screw plate fixation of  the cervical spine again identified.  Preoperative labs noted.  BP trends since 03/21/12 appear acceptable for OR.  She is on Coreg and Norvasc.  She had no ischemia on recent stress test.  If no significant change, then would anticipate she can proceed as planned.  She will be evaluated by her anesthesiologist on the day of surgery.  Shonna Chock, PA-C 03/27/12 1255

## 2012-03-28 NOTE — Progress Notes (Signed)
SPOKE WITH SUSAN AT DR. Luiz Blare OFFICE WHO STATED SHE SPOKE WITH DR. Annitta Jersey OFFICE AND WILL FAX OVER CARDIAC CLEARANCE IF THEY RECEIVE.

## 2012-03-30 MED ORDER — CEFAZOLIN SODIUM-DEXTROSE 2-3 GM-% IV SOLR
2.0000 g | INTRAVENOUS | Status: AC
  Administered 2012-03-31: 2 g via INTRAVENOUS
  Filled 2012-03-30: qty 50

## 2012-03-31 ENCOUNTER — Encounter (HOSPITAL_COMMUNITY): Payer: Self-pay | Admitting: Vascular Surgery

## 2012-03-31 ENCOUNTER — Encounter (HOSPITAL_COMMUNITY)
Admission: RE | Disposition: A | Discharge status: Home Health | Payer: Self-pay | Source: Ambulatory Visit | Attending: Orthopedic Surgery

## 2012-03-31 ENCOUNTER — Inpatient Hospital Stay (HOSPITAL_COMMUNITY): Payer: Worker's Compensation | Admitting: Vascular Surgery

## 2012-03-31 ENCOUNTER — Inpatient Hospital Stay (HOSPITAL_COMMUNITY)
Admission: RE | Admit: 2012-03-31 | Discharge: 2012-04-03 | DRG: 470 | Disposition: A | Discharge status: Home Health | Payer: Worker's Compensation | Source: Ambulatory Visit | Attending: Orthopedic Surgery | Admitting: Orthopedic Surgery

## 2012-03-31 ENCOUNTER — Encounter (HOSPITAL_COMMUNITY): Payer: Self-pay | Admitting: *Deleted

## 2012-03-31 DIAGNOSIS — I2510 Atherosclerotic heart disease of native coronary artery without angina pectoris: Secondary | ICD-10-CM | POA: Diagnosis present

## 2012-03-31 DIAGNOSIS — M1711 Unilateral primary osteoarthritis, right knee: Secondary | ICD-10-CM

## 2012-03-31 DIAGNOSIS — Z8673 Personal history of transient ischemic attack (TIA), and cerebral infarction without residual deficits: Secondary | ICD-10-CM

## 2012-03-31 DIAGNOSIS — IMO0002 Reserved for concepts with insufficient information to code with codable children: Principal | ICD-10-CM | POA: Diagnosis present

## 2012-03-31 DIAGNOSIS — M1710 Unilateral primary osteoarthritis, unspecified knee: Principal | ICD-10-CM | POA: Diagnosis present

## 2012-03-31 DIAGNOSIS — Z79899 Other long term (current) drug therapy: Secondary | ICD-10-CM

## 2012-03-31 DIAGNOSIS — E785 Hyperlipidemia, unspecified: Secondary | ICD-10-CM

## 2012-03-31 DIAGNOSIS — I10 Essential (primary) hypertension: Secondary | ICD-10-CM

## 2012-03-31 DIAGNOSIS — D649 Anemia, unspecified: Secondary | ICD-10-CM | POA: Diagnosis present

## 2012-03-31 DIAGNOSIS — I252 Old myocardial infarction: Secondary | ICD-10-CM

## 2012-03-31 HISTORY — PX: KNEE ARTHROPLASTY: SHX992

## 2012-03-31 SURGERY — ARTHROPLASTY, KNEE, TOTAL, USING IMAGELESS COMPUTER-ASSISTED NAVIGATION
Anesthesia: General | Site: Knee | Laterality: Right | Wound class: Clean

## 2012-03-31 MED ORDER — CEFUROXIME SODIUM 1.5 G IJ SOLR
INTRAMUSCULAR | Status: AC
  Filled 2012-03-31: qty 1.5

## 2012-03-31 MED ORDER — ACETAMINOPHEN 10 MG/ML IV SOLN
INTRAVENOUS | Status: AC
  Filled 2012-03-31: qty 100

## 2012-03-31 MED ORDER — LACTATED RINGERS IV SOLN
INTRAVENOUS | Status: DC | PRN
  Administered 2012-03-31 (×2): via INTRAVENOUS

## 2012-03-31 MED ORDER — HYDROMORPHONE HCL PF 1 MG/ML IJ SOLN
1.0000 mg | INTRAMUSCULAR | Status: DC | PRN
  Filled 2012-03-31: qty 1

## 2012-03-31 MED ORDER — PROMETHAZINE HCL 25 MG/ML IJ SOLN: 6.2500 mg | INTRAMUSCULAR | Status: DC | PRN

## 2012-03-31 MED ORDER — LACTATED RINGERS IV SOLN
INTRAVENOUS | Status: DC
  Administered 2012-03-31: 12:00:00 via INTRAVENOUS

## 2012-03-31 MED ORDER — HYDROMORPHONE HCL PF 1 MG/ML IJ SOLN
INTRAMUSCULAR | Status: AC
  Filled 2012-03-31: qty 1

## 2012-03-31 MED ORDER — ACETAMINOPHEN 10 MG/ML IV SOLN
1000.0000 mg | Freq: Four times a day (QID) | INTRAVENOUS | Status: AC
  Administered 2012-04-01 – 2012-04-02 (×4): 1000 mg via INTRAVENOUS
  Filled 2012-03-31 (×6): qty 100

## 2012-03-31 MED ORDER — FENTANYL CITRATE 0.05 MG/ML IJ SOLN
50.0000 ug | Freq: Once | INTRAMUSCULAR | Status: AC
  Administered 2012-03-31: 100 ug via INTRAVENOUS

## 2012-03-31 MED ORDER — METOCLOPRAMIDE HCL 10 MG PO TABS: 5.0000 mg | ORAL_TABLET | Freq: Three times a day (TID) | ORAL | Status: DC | PRN

## 2012-03-31 MED ORDER — FERROUS SULFATE 325 (65 FE) MG PO TABS
325.0000 mg | ORAL_TABLET | Freq: Two times a day (BID) | ORAL | Status: DC
  Administered 2012-04-01 – 2012-04-03 (×5): 325 mg via ORAL
  Filled 2012-03-31 (×9): qty 1

## 2012-03-31 MED ORDER — GLYCOPYRROLATE 0.2 MG/ML IJ SOLN
INTRAMUSCULAR | Status: DC | PRN
  Administered 2012-03-31: 0.6 mg via INTRAVENOUS

## 2012-03-31 MED ORDER — OXYCODONE HCL 5 MG PO TABS: 5.0000 mg | ORAL_TABLET | Freq: Once | ORAL | Status: DC | PRN

## 2012-03-31 MED ORDER — LIDOCAINE HCL (CARDIAC) 20 MG/ML IV SOLN
INTRAVENOUS | Status: DC | PRN
  Administered 2012-03-31: 100 mg via INTRAVENOUS

## 2012-03-31 MED ORDER — FENTANYL CITRATE 0.05 MG/ML IJ SOLN
INTRAMUSCULAR | Status: DC | PRN
  Administered 2012-03-31 (×2): 50 ug via INTRAVENOUS
  Administered 2012-03-31: 100 ug via INTRAVENOUS
  Administered 2012-03-31: 50 ug via INTRAVENOUS

## 2012-03-31 MED ORDER — WARFARIN SODIUM 5 MG PO TABS: ORAL_TABLET | ORAL | Status: DC

## 2012-03-31 MED ORDER — MIDAZOLAM HCL 2 MG/2ML IJ SOLN
INTRAMUSCULAR | Status: AC
  Filled 2012-03-31: qty 2

## 2012-03-31 MED ORDER — METOCLOPRAMIDE HCL 5 MG/ML IJ SOLN: 5.0000 mg | Freq: Three times a day (TID) | INTRAMUSCULAR | Status: DC | PRN

## 2012-03-31 MED ORDER — DEXAMETHASONE SODIUM PHOSPHATE 4 MG/ML IJ SOLN
INTRAMUSCULAR | Status: DC | PRN
  Administered 2012-03-31: 4 mg

## 2012-03-31 MED ORDER — OXYCODONE HCL 5 MG PO TABS
5.0000 mg | ORAL_TABLET | ORAL | Status: DC | PRN
  Administered 2012-03-31 – 2012-04-03 (×10): 10 mg via ORAL
  Administered 2012-04-03: 5 mg via ORAL
  Filled 2012-03-31 (×4): qty 2
  Filled 2012-03-31: qty 1
  Filled 2012-03-31 (×6): qty 2

## 2012-03-31 MED ORDER — MIDAZOLAM HCL 2 MG/2ML IJ SOLN
1.0000 mg | INTRAMUSCULAR | Status: DC | PRN
  Administered 2012-03-31: 2 mg via INTRAVENOUS

## 2012-03-31 MED ORDER — OXYCODONE HCL 5 MG/5ML PO SOLN: 5.0000 mg | Freq: Once | ORAL | Status: DC | PRN

## 2012-03-31 MED ORDER — DIPHENHYDRAMINE HCL 12.5 MG/5ML PO ELIX: 12.5000 mg | ORAL_SOLUTION | ORAL | Status: DC | PRN

## 2012-03-31 MED ORDER — DEXTROSE-NACL 5-0.45 % IV SOLN
INTRAVENOUS | Status: DC
  Administered 2012-03-31: 20:00:00 via INTRAVENOUS

## 2012-03-31 MED ORDER — ONDANSETRON HCL 4 MG/2ML IJ SOLN: 4.0000 mg | Freq: Four times a day (QID) | INTRAMUSCULAR | Status: DC | PRN

## 2012-03-31 MED ORDER — CARVEDILOL 6.25 MG PO TABS
6.2500 mg | ORAL_TABLET | Freq: Two times a day (BID) | ORAL | Status: DC
  Administered 2012-04-01 – 2012-04-03 (×5): 6.25 mg via ORAL
  Filled 2012-03-31 (×8): qty 1

## 2012-03-31 MED ORDER — CEFAZOLIN SODIUM-DEXTROSE 2-3 GM-% IV SOLR
2.0000 g | Freq: Four times a day (QID) | INTRAVENOUS | Status: AC
  Administered 2012-03-31 – 2012-04-01 (×2): 2 g via INTRAVENOUS
  Filled 2012-03-31 (×2): qty 50

## 2012-03-31 MED ORDER — WARFARIN - PHARMACIST DOSING INPATIENT: Freq: Every day | Status: DC

## 2012-03-31 MED ORDER — ALUM & MAG HYDROXIDE-SIMETH 200-200-20 MG/5ML PO SUSP: 30.0000 mL | ORAL | Status: DC | PRN

## 2012-03-31 MED ORDER — AMLODIPINE BESYLATE 10 MG PO TABS
10.0000 mg | ORAL_TABLET | Freq: Every day | ORAL | Status: DC
  Administered 2012-04-01 – 2012-04-03 (×3): 10 mg via ORAL
  Filled 2012-03-31 (×3): qty 1

## 2012-03-31 MED ORDER — EPHEDRINE SULFATE 50 MG/ML IJ SOLN
INTRAMUSCULAR | Status: DC | PRN
  Administered 2012-03-31: 10 mg via INTRAVENOUS
  Administered 2012-03-31: 5 mg via INTRAVENOUS
  Administered 2012-03-31: 10 mg via INTRAVENOUS

## 2012-03-31 MED ORDER — CHLORHEXIDINE GLUCONATE 4 % EX LIQD: 60.0000 mL | Freq: Once | CUTANEOUS | Status: DC

## 2012-03-31 MED ORDER — ARTIFICIAL TEARS OP OINT
TOPICAL_OINTMENT | OPHTHALMIC | Status: DC | PRN
  Administered 2012-03-31: 1 via OPHTHALMIC

## 2012-03-31 MED ORDER — PROPOFOL 10 MG/ML IV BOLUS
INTRAVENOUS | Status: DC | PRN
  Administered 2012-03-31: 150 mg via INTRAVENOUS
  Administered 2012-03-31: 50 mg via INTRAVENOUS

## 2012-03-31 MED ORDER — KETOROLAC TROMETHAMINE 30 MG/ML IJ SOLN
15.0000 mg | Freq: Four times a day (QID) | INTRAMUSCULAR | Status: AC
  Administered 2012-03-31 – 2012-04-01 (×3): 15 mg via INTRAVENOUS
  Filled 2012-03-31 (×5): qty 1

## 2012-03-31 MED ORDER — ATORVASTATIN CALCIUM 10 MG PO TABS
10.0000 mg | ORAL_TABLET | Freq: Every day | ORAL | Status: DC
  Administered 2012-04-01 – 2012-04-03 (×3): 10 mg via ORAL
  Filled 2012-03-31 (×3): qty 1

## 2012-03-31 MED ORDER — CEFUROXIME SODIUM 1.5 G IJ SOLR
INTRAMUSCULAR | Status: DC | PRN
  Administered 2012-03-31: 1.5 g

## 2012-03-31 MED ORDER — METHOCARBAMOL 500 MG PO TABS
500.0000 mg | ORAL_TABLET | Freq: Four times a day (QID) | ORAL | Status: DC | PRN
  Administered 2012-03-31 – 2012-04-02 (×5): 500 mg via ORAL
  Filled 2012-03-31 (×4): qty 1

## 2012-03-31 MED ORDER — COUMADIN BOOK
Freq: Once | Status: AC
  Administered 2012-03-31: 21:00:00
  Filled 2012-03-31: qty 1

## 2012-03-31 MED ORDER — HYDROMORPHONE HCL PF 1 MG/ML IJ SOLN
0.2500 mg | INTRAMUSCULAR | Status: DC | PRN
  Administered 2012-03-31 (×4): 0.5 mg via INTRAVENOUS

## 2012-03-31 MED ORDER — ROCURONIUM BROMIDE 100 MG/10ML IV SOLN
INTRAVENOUS | Status: DC | PRN
  Administered 2012-03-31: 50 mg via INTRAVENOUS

## 2012-03-31 MED ORDER — ONDANSETRON HCL 4 MG PO TABS: 4.0000 mg | ORAL_TABLET | Freq: Four times a day (QID) | ORAL | Status: DC | PRN

## 2012-03-31 MED ORDER — NEOSTIGMINE METHYLSULFATE 1 MG/ML IJ SOLN
INTRAMUSCULAR | Status: DC | PRN
  Administered 2012-03-31: 3 mg via INTRAVENOUS

## 2012-03-31 MED ORDER — ACETAMINOPHEN 10 MG/ML IV SOLN
INTRAVENOUS | Status: DC | PRN
  Administered 2012-03-31: 1000 mg via INTRAVENOUS

## 2012-03-31 MED ORDER — BUPIVACAINE-EPINEPHRINE PF 0.5-1:200000 % IJ SOLN
INTRAMUSCULAR | Status: DC | PRN
  Administered 2012-03-31: 25 mL

## 2012-03-31 MED ORDER — POLYETHYLENE GLYCOL 3350 17 G PO PACK: 17.0000 g | PACK | Freq: Every day | ORAL | Status: DC | PRN

## 2012-03-31 MED ORDER — METHOCARBAMOL 750 MG PO TABS: ORAL_TABLET | ORAL | Status: DC

## 2012-03-31 MED ORDER — POVIDONE-IODINE 7.5 % EX SOLN
Freq: Once | CUTANEOUS | Status: DC
  Filled 2012-03-31: qty 118

## 2012-03-31 MED ORDER — OXYCODONE-ACETAMINOPHEN 5-325 MG PO TABS: 1.0000 | ORAL_TABLET | ORAL | Status: DC | PRN

## 2012-03-31 MED ORDER — DOCUSATE SODIUM 100 MG PO CAPS
100.0000 mg | ORAL_CAPSULE | Freq: Two times a day (BID) | ORAL | Status: DC
Start: 2012-03-31 — End: 2012-04-03
  Administered 2012-04-01 – 2012-04-03 (×5): 100 mg via ORAL
  Filled 2012-03-31 (×6): qty 1

## 2012-03-31 MED ORDER — FENTANYL CITRATE 0.05 MG/ML IJ SOLN
INTRAMUSCULAR | Status: AC
  Filled 2012-03-31: qty 2

## 2012-03-31 MED ORDER — ONDANSETRON HCL 4 MG/2ML IJ SOLN
INTRAMUSCULAR | Status: DC | PRN
  Administered 2012-03-31: 4 mg via INTRAVENOUS

## 2012-03-31 MED ORDER — METHOCARBAMOL 100 MG/ML IJ SOLN
500.0000 mg | Freq: Four times a day (QID) | INTRAVENOUS | Status: DC | PRN
  Filled 2012-03-31: qty 5

## 2012-03-31 MED ORDER — SODIUM CHLORIDE 0.9 % IR SOLN
Status: DC | PRN
  Administered 2012-03-31: 1000 mL
  Administered 2012-03-31: 3000 mL

## 2012-03-31 MED ORDER — WARFARIN VIDEO: Freq: Once | Status: DC

## 2012-03-31 MED ORDER — ZOLPIDEM TARTRATE 5 MG PO TABS: 5.0000 mg | ORAL_TABLET | Freq: Every evening | ORAL | Status: DC | PRN

## 2012-03-31 MED ORDER — WARFARIN SODIUM 7.5 MG PO TABS
7.5000 mg | ORAL_TABLET | Freq: Once | ORAL | Status: AC
  Administered 2012-03-31: 7.5 mg via ORAL
  Filled 2012-03-31: qty 1

## 2012-03-31 SURGICAL SUPPLY — 73 items
APL SKNCLS STERI-STRIP NONHPOA (GAUZE/BANDAGES/DRESSINGS) ×1
BANDAGE ESMARK 6X9 LF (GAUZE/BANDAGES/DRESSINGS) ×1 IMPLANT
BENZOIN TINCTURE PRP APPL 2/3 (GAUZE/BANDAGES/DRESSINGS) ×2 IMPLANT
BLADE SAGITTAL 25.0X1.19X90 (BLADE) ×2 IMPLANT
BLADE SAW SAG 90X13X1.27 (BLADE) ×2 IMPLANT
BNDG CMPR 9X6 STRL LF SNTH (GAUZE/BANDAGES/DRESSINGS) ×1
BNDG ESMARK 6X9 LF (GAUZE/BANDAGES/DRESSINGS) ×2
BOWL SMART MIX CTS (DISPOSABLE) ×2 IMPLANT
CEMENT HV SMART SET (Cement) ×4 IMPLANT
CLOTH BEACON ORANGE TIMEOUT ST (SAFETY) ×2 IMPLANT
COVER BACK TABLE 24X17X13 BIG (DRAPES) IMPLANT
COVER SURGICAL LIGHT HANDLE (MISCELLANEOUS) ×2 IMPLANT
CUFF TOURNIQUET SINGLE 34IN LL (TOURNIQUET CUFF) ×2 IMPLANT
CUFF TOURNIQUET SINGLE 44IN (TOURNIQUET CUFF) IMPLANT
DRAPE EXTREMITY T 121X128X90 (DRAPE) ×2 IMPLANT
DRAPE U-SHAPE 47X51 STRL (DRAPES) ×2 IMPLANT
DRSG PAD ABDOMINAL 8X10 ST (GAUZE/BANDAGES/DRESSINGS) ×2 IMPLANT
DURAPREP 26ML APPLICATOR (WOUND CARE) ×2 IMPLANT
ELECT REM PT RETURN 9FT ADLT (ELECTROSURGICAL) ×2
ELECTRODE REM PT RTRN 9FT ADLT (ELECTROSURGICAL) ×1 IMPLANT
EVACUATOR 1/8 PVC DRAIN (DRAIN) ×2 IMPLANT
FACESHIELD LNG OPTICON STERILE (SAFETY) ×2 IMPLANT
GAUZE XEROFORM 5X9 LF (GAUZE/BANDAGES/DRESSINGS) ×2 IMPLANT
GLOVE BIO SURGEON STRL SZ 6.5 (GLOVE) ×1 IMPLANT
GLOVE BIO SURGEON STRL SZ7.5 (GLOVE) ×1 IMPLANT
GLOVE BIOGEL PI IND STRL 7.5 (GLOVE) IMPLANT
GLOVE BIOGEL PI IND STRL 8 (GLOVE) ×2 IMPLANT
GLOVE BIOGEL PI INDICATOR 7.5 (GLOVE) ×1
GLOVE BIOGEL PI INDICATOR 8 (GLOVE) ×5
GLOVE ECLIPSE 7.5 STRL STRAW (GLOVE) ×5 IMPLANT
GOWN PREVENTION PLUS XLARGE (GOWN DISPOSABLE) ×2 IMPLANT
GOWN SRG XL XLNG 56XLVL 4 (GOWN DISPOSABLE) ×1 IMPLANT
GOWN STRL NON-REIN LRG LVL3 (GOWN DISPOSABLE) ×4 IMPLANT
GOWN STRL NON-REIN XL XLG LVL4 (GOWN DISPOSABLE) ×2
GOWN STRL REIN 2XL XLG LVL4 (GOWN DISPOSABLE) ×2 IMPLANT
HANDPIECE INTERPULSE COAX TIP (DISPOSABLE) ×2
HOOD PEEL AWAY FACE SHEILD DIS (HOOD) ×6 IMPLANT
IMMOBILIZER KNEE 20 (SOFTGOODS)
IMMOBILIZER KNEE 20 THIGH 36 (SOFTGOODS) IMPLANT
IMMOBILIZER KNEE 22 UNIV (SOFTGOODS) ×2 IMPLANT
IMMOBILIZER KNEE 24 THIGH 36 (MISCELLANEOUS) IMPLANT
IMMOBILIZER KNEE 24 UNIV (MISCELLANEOUS)
KIT BASIN OR (CUSTOM PROCEDURE TRAY) ×2 IMPLANT
KIT ROOM TURNOVER OR (KITS) ×2 IMPLANT
MANIFOLD NEPTUNE II (INSTRUMENTS) ×2 IMPLANT
MARKER SPHERE PSV REFLC THRD 5 (MARKER) ×6 IMPLANT
NDL HYPO 25GX1X1/2 BEV (NEEDLE) IMPLANT
NEEDLE HYPO 25GX1X1/2 BEV (NEEDLE) ×2 IMPLANT
NS IRRIG 1000ML POUR BTL (IV SOLUTION) ×2 IMPLANT
PACK TOTAL JOINT (CUSTOM PROCEDURE TRAY) ×2 IMPLANT
PAD ARMBOARD 7.5X6 YLW CONV (MISCELLANEOUS) ×3 IMPLANT
PAD CAST 4YDX4 CTTN HI CHSV (CAST SUPPLIES) ×1 IMPLANT
PADDING CAST COTTON 4X4 STRL (CAST SUPPLIES) ×2
PIN SCHANZ 4MM 130MM (PIN) ×8 IMPLANT
SET HNDPC FAN SPRY TIP SCT (DISPOSABLE) ×1 IMPLANT
SPONGE GAUZE 4X4 12PLY (GAUZE/BANDAGES/DRESSINGS) ×2 IMPLANT
STAPLER VISISTAT 35W (STAPLE) IMPLANT
STRIP CLOSURE SKIN 1/2X4 (GAUZE/BANDAGES/DRESSINGS) ×2 IMPLANT
SUCTION FRAZIER TIP 10 FR DISP (SUCTIONS) ×1 IMPLANT
SUT MNCRL AB 3-0 PS2 18 (SUTURE) ×1 IMPLANT
SUT MON AB 3-0 SH 27 (SUTURE)
SUT MON AB 3-0 SH27 (SUTURE) IMPLANT
SUT VIC AB 0 CT1 27 (SUTURE) ×2
SUT VIC AB 0 CT1 27XBRD ANBCTR (SUTURE) IMPLANT
SUT VIC AB 0 CTB1 27 (SUTURE) ×4 IMPLANT
SUT VIC AB 1 CT1 27 (SUTURE) ×4
SUT VIC AB 1 CT1 27XBRD ANBCTR (SUTURE) ×2 IMPLANT
SUT VIC AB 2-0 CTB1 (SUTURE) ×4 IMPLANT
SYR CONTROL 10ML LL (SYRINGE) ×1 IMPLANT
TOWEL OR 17X24 6PK STRL BLUE (TOWEL DISPOSABLE) ×2 IMPLANT
TOWEL OR 17X26 10 PK STRL BLUE (TOWEL DISPOSABLE) ×2 IMPLANT
TRAY FOLEY CATH 14FR (SET/KITS/TRAYS/PACK) ×2 IMPLANT
WATER STERILE IRR 1000ML POUR (IV SOLUTION) ×4 IMPLANT

## 2012-03-31 NOTE — Op Note (Signed)
NAMEKJERSTIN, ABRIGO NO.:  [id number]  MEDICAL RECORD NO.:  [id number]  LOCATION:  5N01C                        FACILITY:  [facility]  PHYSICIAN:  Harvie Junior, M.D.   DATE OF BIRTH:  January 28, 1956  DATE OF PROCEDURE:  03/31/2012 DATE OF DISCHARGE:                              OPERATIVE REPORT   PREOPERATIVE DIAGNOSIS:  End-stage degenerative joint disease, right knee.  POSTOPERATIVE DIAGNOSIS:  End-stage degenerative joint disease, right knee.  PROCEDURE: 1. Right total knee replacement with a Sigma system, size 3 femur,     size 3 tibia, 10 mm bridging bearing, and a 35-mm all-poly patella. 2. Computer-assisted right total knee replacement.  SURGEON:  Harvie Junior, M.D.  ASSISTANT:  Marshia Ly, PA.  ANESTHESIA:  General.  BRIEF HISTORY:  Ms. Romito is a 57 year old female with a history of having significant complaints of right knee pain.  She had been treated conservatively with activity modification, anti-inflammatory medication, and injection therapy.  She had undergone arthroscopic treatment.  After failure of all these conservative care, she was taken to the operating room for right total knee replacement.  Because of her young age and need for perfect neutral long alignment, we felt the computer-assisted technique was appropriate, this was chosen to be used and preoperatively she was brought to the operating room for this procedure.  PROCEDURE:  The patient was brought to the operating room and after adequate anesthesia was obtained with general anesthetic, the patient was placed supine on the operating table.  The right leg was then prepped and draped in usual sterile fashion.  Following this, the leg was exsanguinated.  Blood pressure tourniquet inflated to 350 mmHg. Following this, a midline incision was made, subcutaneous tissue down the level of the extensor mechanism, and a medial parapatellar arthrotomy was undertaken.  Once this was  completed, attention was turned towards removing medial and lateral meniscus, retropatellar fat pad, synovium in the anterior aspect of the femur, and anterior posterior cruciates.  Attention was then turned to the placement of the computer modules, 2 pins in the tibia, 2 pins in the femur, and once that was completed, the computer registration process was undertaken. This add to 30 minutes of surgical procedure.  Once the registration process was undertaken, the tibia was then perpendicular to its long axis.  The femur was then cut perpendicular to the anatomic axis under computer assistance.  Attention was then turned towards sizing of the femur.  It sized to a 3.  Anterior and posterior cuts were made, chamfers and box. Attention was turned to the tibia, sized to a 3, drilled and keeled and following this, the trial component 10 mm bearing was put in place.  Checked the computer, perfect neutral long alignment and gap balance.  Attention turned to the patella at this point, which was sized to a 35, was drilled and a trial patella was put in place. Knee put through a range of motion, excellent stability, and at this point, the trial components were removed, and the knee was copiously and thoroughly lavaged and suctioned dry.  Once this was done, the final components were cemented in place, size 3 tibia,  size 3 femur, 10 mm bridging bearing, and a 35-mm all-poly patella was held with a clamp. Cement was allowed to harden.  Trial 10 poly was put in place.  Once the cement was allowed to completely harden, the tourniquet was let down, and all bleeders controlled electrocautery.  The knee was copiously and thoroughly lavaged, suctioned dry.  At this point, a trial poly was then put in place and once the poly was put in place, the knee was put through a range of motion.  Excellent range of motion, stability. Patellar tracking was normal.  At this point, a medium Hemovac drain was placed.   The medial parapatellar arthrotomy was closed with 1 Vicryl running, skin with 0 and 2-0 Vicryl, and 3-0 Monocryl subcuticular. Benzoin and Steri-Strips were applied.  Sterile compressive dressing was applied.  The patient was taken to the recovery room and was noted to be in satisfactory condition.  Estimated blood loss for the procedure was none.     Harvie Junior, M.D.     Ranae Plumber  D:  03/31/2012  T:  03/31/2012  Job:  [id number]

## 2012-03-31 NOTE — Progress Notes (Signed)
ANTICOAGULATION CONSULT NOTE - Initial Consult  Pharmacy Consult for Coumadin Indication: VTE prophylaxis  Allergies  Allergen Reactions  . Ace Inhibitors Swelling    Angioedema    Patient Measurements: Height: 5\' 7"  (170.2 cm) Weight: 193 lb (87.544 kg) IBW/kg (Calculated) : 61.6   Vital Signs: Temp: 97.2 F (36.2 C) (02/07 1845) Temp src: Oral (02/07 1038) BP: 133/78 mmHg (02/07 1842) Pulse Rate: 95  (02/07 1851)  Labs: No results found for this basename: HGB:2,HCT:3,PLT:3,APTT:3,LABPROT:3,INR:3,HEPARINUNFRC:3,CREATININE:3,CKTOTAL:3,CKMB:3,TROPONINI:3 in the last 72 hours  Estimated Creatinine Clearance: 88.2 ml/min (by C-G formula based on Cr of 0.74).  INR 0.97 PT 12.8   Medical History: Past Medical History  Diagnosis Date  . Acute non Q wave MI (myocardial infarction), subsequent episode   . CVA (cerebral infarction)   . Hyperlipidemia   . Hypertension   . Angioedema   . Stroke   . Bronchitis   . Arthritis     Medications:  Prescriptions prior to admission  Medication Sig Dispense Refill  . amLODipine (NORVASC) 10 MG tablet Take 1 tablet (10 mg total) by mouth daily.  30 tablet  0  . atorvastatin (LIPITOR) 10 MG tablet Take 1 tablet (10 mg total) by mouth daily.  30 tablet  0  . carvedilol (COREG) 12.5 MG tablet Take 6.25 mg by mouth 2 (two) times daily with a meal.        Assessment: 57 y/o female to begin Coumadin s/p R TKA for VTE prophylaxis x1 month. INR and CBC are normal at baseline. No post-op bleeding noted. Coumadin score is 6.  Goal of Therapy:  INR ~2 per MD request Monitor platelets by anticoagulation protocol: Yes   Plan:  -Coumadin 7.5 mg po tonight -INR daily -Monitor for sign/symptoms of bleeding -Coumadin book and video  [facility], [address].D., BCPS Clinical Pharmacist Pager: [phone] 03/31/2012 7:57 PM

## 2012-03-31 NOTE — Transfer of Care (Signed)
Immediate Anesthesia Transfer of Care Note  Patient: Teresa [name]  Procedure(s) Performed: Procedure(s) (LRB) with comments: COMPUTER ASSISTED TOTAL KNEE ARTHROPLASTY (Right)  Patient Location: PACU  Anesthesia Type:GA combined with regional for post-op pain  Level of Consciousness: sedated  Airway & Oxygen Therapy: Patient Spontanous Breathing and Patient connected to nasal cannula oxygen  Post-op Assessment: Report given to PACU RN and Post -op Vital signs reviewed and stable  Post vital signs: Reviewed and stable  Complications: No apparent anesthesia complications

## 2012-03-31 NOTE — Brief Op Note (Signed)
03/31/2012  3:24 PM  PATIENT:  Teresa [name]  57 y.o. female  PRE-OPERATIVE DIAGNOSIS:  degenerative joint disease, right knee  POST-OPERATIVE DIAGNOSIS:  degenerative joint disease, right knee  PROCEDURE:  Procedure(s) (LRB) with comments: COMPUTER ASSISTED TOTAL KNEE ARTHROPLASTY (Right)  SURGEON:  Surgeon(s) and Role:    * Harvie Junior, MD - Primary  PHYSICIAN ASSISTANT:   ASSISTANTS: bethune    ANESTHESIA:   general  EBL:  Total I/O In: 1000 [I.V.:1000] Out: 50 [Urine:50]  BLOOD ADMINISTERED:none  DRAINS: (1) Hemovact drain(s) in the r knee with  Suction Open   LOCAL MEDICATIONS USED:  NONE  SPECIMEN:  No Specimen  DISPOSITION OF SPECIMEN:  N/A  COUNTS:  YES  TOURNIQUET:   Total Tourniquet Time Documented: Thigh (Right) - 85 minutes  DICTATION: .Other Dictation: Dictation Number [id number]  PLAN OF CARE: Admit to inpatient   PATIENT DISPOSITION:  PACU - hemodynamically stable.   Delay start of Pharmacological VTE agent (>24hrs) due to surgical blood loss or risk of bleeding: no

## 2012-03-31 NOTE — Anesthesia Postprocedure Evaluation (Signed)
Anesthesia Post Note  Patient: Teresa [name]  Procedure(s) Performed: Procedure(s) (LRB): COMPUTER ASSISTED TOTAL KNEE ARTHROPLASTY (Right)  Anesthesia type: general  Patient location: PACU  Post pain: Pain level controlled  Post assessment: Patient's Cardiovascular Status Stable  Last Vitals:  Filed Vitals:   03/31/12 1600  BP:   Pulse: 85  Temp:   Resp: 11    Post vital signs: Reviewed and stable  Level of consciousness: sedated  Complications: No apparent anesthesia complications

## 2012-03-31 NOTE — Preoperative (Signed)
Beta Blockers   Reason not to administer Beta Blockers:coreg 03/31/12

## 2012-03-31 NOTE — Progress Notes (Signed)
Orthopedic Tech Progress Note Patient Details:  Teresa [name] 1956/02/16 [id number]  CPM Right Knee CPM Right Knee: On Right Knee Flexion (Degrees): 60  Right Knee Extension (Degrees): 0  Additional Comments: right leg trapeze bar patient helper Viewed order from doctor's order list  [name] 03/31/2012, 5:02 PM

## 2012-03-31 NOTE — Anesthesia Procedure Notes (Addendum)
Anesthesia Regional Block:  Femoral nerve block  Pre-Anesthetic Checklist: ,, timeout performed, Correct Patient, Correct Site, Correct Laterality, Correct Procedure, Correct Position, site marked, Risks and benefits discussed,  Surgical consent,  Pre-op evaluation,  At surgeon's request and post-op pain management  Laterality: Right  Prep: chloraprep       Needles:   Needle Type: Echogenic Stimulator Needle      Needle Gauge: 22 and 22 G    Additional Needles:  Procedures: nerve stimulator Femoral nerve block  Nerve Stimulator or Paresthesia:  Response: 0.48 mA,   Additional Responses:   Narrative:  Start time: 03/31/2012 12:00 PM End time: 03/31/2012 12:14 PM Injection made incrementally with aspirations every 5 mL. Anesthesiologist: Dr Gypsy Balsam  Additional Notes: 1610-9604 R FNB POP FBP, sterile tech CHG prep #22 stim needle w/stim down to .48ma Multiple neg asp Marc .5% w/epi 1:200000 total 25cc+decadron 4mg  infiltrated No compl Dr Gypsy Balsam   Date/Time: 03/31/2012 1:37 PM Performed by: Cathie Olden B    Procedure Name: Intubation Date/Time: 03/31/2012 1:37 PM Performed by: Sherie Don Pre-anesthesia Checklist: Patient identified, Emergency Drugs available, Suction available, Patient being monitored and Timeout performed Patient Re-evaluated:Patient Re-evaluated prior to inductionOxygen Delivery Method: Circle system utilized Preoxygenation: Pre-oxygenation with 100% oxygen Intubation Type: IV induction Ventilation: Mask ventilation without difficulty and Oral airway inserted - appropriate to patient size Laryngoscope Size: Mac and 3 Grade View: Grade III Tube type: Oral Tube size: 7.5 mm Number of attempts: 1 Airway Equipment and Method: Stylet and LTA kit utilized Placement Confirmation: ETT inserted through vocal cords under direct vision,  positive ETCO2 and breath sounds checked- equal and bilateral Secured at: 22 cm Tube secured with:  Tape Dental Injury: Teeth and Oropharynx as per pre-operative assessment

## 2012-03-31 NOTE — Progress Notes (Signed)
Orthopedic Tech Progress Note Patient Details:  Teresa [name] 11-13-1955 [id number]      [name] 03/31/2012, 4:57 PM

## 2012-03-31 NOTE — Anesthesia Preprocedure Evaluation (Signed)
Anesthesia Evaluation  Patient identified by MRN, date of birth, ID band Patient awake    Reviewed: Allergy & Precautions, H&P , NPO status , Patient's Chart, lab work & pertinent test results  Airway Mallampati: II TM Distance: >3 FB Neck ROM: Full    Dental   Pulmonary COPDformer smoker,  breath sounds clear to auscultation        Cardiovascular hypertension, + CAD and + Past MI Rhythm:Regular Rate:Normal     Neuro/Psych CVA    GI/Hepatic   Endo/Other    Renal/GU      Musculoskeletal   Abdominal (+) + obese,   Peds  Hematology   Anesthesia Other Findings   Reproductive/Obstetrics                           Anesthesia Physical Anesthesia Plan  ASA: III  Anesthesia Plan: General   Post-op Pain Management:    Induction: Intravenous  Airway Management Planned: Oral ETT  Additional Equipment:   Intra-op Plan:   Post-operative Plan: Extubation in OR  Informed Consent: I have reviewed the patients History and Physical, chart, labs and discussed the procedure including the risks, benefits and alternatives for the proposed anesthesia with the patient or authorized representative who has indicated his/her understanding and acceptance.     Plan Discussed with: CRNA and Surgeon  Anesthesia Plan Comments:         Anesthesia Quick Evaluation

## 2012-03-31 NOTE — H&P (Signed)
TOTAL KNEE ADMISSION H&P  Patient is being admitted for right total knee arthroplasty.  Subjective:  Chief Complaint:right knee pain.  HPI: Teresa [name], 57 y.o. female, has a history of pain and functional disability in the right knee due to arthritis and has failed non-surgical conservative treatments for greater than 12 weeks to includeNSAID's and/or analgesics, corticosteriod injections, viscosupplementation injections, flexibility and strengthening excercises and use of assistive devices.  Onset of symptoms was gradual, starting 5 years ago with gradually worsening course since that time. The patient noted no past surgery on the right knee(s).  Patient currently rates pain in the right knee(s) at 7 out of 10 with activity. Patient has night pain, worsening of pain with activity and weight bearing, pain that interferes with activities of daily living and joint swelling.  Patient has evidence of subchondral sclerosis, periarticular osteophytes and joint space narrowing by imaging studies. This patient has had exhaustive conservative care. There is no active infection.  Patient Active Problem List   Diagnosis Date Noted  . HTN (hypertension) 03/17/2012  . CAD (coronary artery disease) 03/17/2012  . HLD (hyperlipidemia) 03/17/2012  . Anemia 03/17/2012  . DJD (degenerative joint disease) of knee 03/17/2012   Past Medical History  Diagnosis Date  . Acute non Q wave MI (myocardial infarction), subsequent episode   . CVA (cerebral infarction)   . Hyperlipidemia   . Hypertension   . Angioedema   . Stroke   . Bronchitis   . Arthritis     Past Surgical History  Procedure Date  . Neck surgery     rod in neck  . Knee arthroscopy w/ meniscal repair     right    Prescriptions prior to admission  Medication Sig Dispense Refill  . amLODipine (NORVASC) 10 MG tablet Take 1 tablet (10 mg total) by mouth daily.  30 tablet  0  . atorvastatin (LIPITOR) 10 MG tablet Take 1 tablet (10 mg total)  by mouth daily.  30 tablet  0  . carvedilol (COREG) 12.5 MG tablet Take 6.25 mg by mouth 2 (two) times daily with a meal.       Allergies  Allergen Reactions  . Ace Inhibitors Swelling    Angioedema    History  Substance Use Topics  . Smoking status: Former Smoker    Quit date: 02/23/2000  . Smokeless tobacco: Never Used  . Alcohol Use: 1.2 oz/week    2 Glasses of wine per week     Comment: occassional    History reviewed. No pertinent family history.   ROS  Objective:  Physical Exam  Vital signs in last 24 hours: Temp:  [97.9 F (36.6 C)] 97.9 F (36.6 C) (02/07 1038) Pulse Rate:  [70] 70  (02/07 1038) Resp:  [18] 18  (02/07 1038) BP: (138)/(90) 138/90 mmHg (02/07 1038) SpO2:  [100 %] 100 % (02/07 1038)  Labs:   There is no height or weight on file to calculate BMI.   Imaging Review Plain radiographs demonstrate severe degenerative joint disease of the right knee(s). The overall alignment ismild valgus. The bone quality appears to be fair for age and reported activity level.  Assessment/Plan:  End stage arthritis, right knee   The patient history, physical examination, clinical judgment of the provider and imaging studies are consistent with end stage degenerative joint disease of the right knee(s) and total knee arthroplasty is deemed medically necessary. The treatment options including medical management, injection therapy arthroscopy and arthroplasty were discussed at length. The  risks and benefits of total knee arthroplasty were presented and reviewed. The risks due to aseptic loosening, infection, stiffness, patella tracking problems, thromboembolic complications and other imponderables were discussed. The patient acknowledged the explanation, agreed to proceed with the plan and consent was signed. Patient is being admitted for inpatient treatment for surgery, pain control, PT, OT, prophylactic antibiotics, VTE prophylaxis, progressive ambulation and ADL's and  discharge planning. The patient is planning to be discharged home with home health services

## 2012-04-01 LAB — CBC
MCH: 28.6 pg (ref 26.0–34.0)
MCHC: 34 g/dL (ref 30.0–36.0)
MCV: 84.2 fL (ref 78.0–100.0)
Platelets: 238 10*3/uL (ref 150–400)
RBC: 3.67 MIL/uL — ABNORMAL LOW (ref 3.87–5.11)
RDW: 15.1 % (ref 11.5–15.5)

## 2012-04-01 LAB — BASIC METABOLIC PANEL
CO2: 24 mEq/L (ref 19–32)
Calcium: 8.5 mg/dL (ref 8.4–10.5)
Creatinine, Ser: 0.68 mg/dL (ref 0.50–1.10)
GFR calc non Af Amer: >90 mL/min (ref >90)

## 2012-04-01 LAB — PROTIME-INR: Prothrombin Time: 14.3 seconds (ref 11.6–15.2)

## 2012-04-01 MED ORDER — WARFARIN SODIUM 7.5 MG PO TABS
7.5000 mg | ORAL_TABLET | Freq: Once | ORAL | Status: AC
  Administered 2012-04-01: 7.5 mg via ORAL
  Filled 2012-04-01: qty 1

## 2012-04-01 NOTE — Progress Notes (Signed)
ANTICOAGULATION CONSULT NOTE - follow up Pharmacy Consult for Coumadin Indication: VTE prophylaxis  Allergies  Allergen Reactions  . Ace Inhibitors Swelling    Angioedema    Patient Measurements: Height: 5\' 7"  (170.2 cm) Weight: 193 lb (87.544 kg) IBW/kg (Calculated) : 61.6  Vital Signs: Temp: 98.7 F (37.1 C) (02/08 0539) BP: 132/72 mmHg (02/08 0539) Pulse Rate: 87 (02/08 0539)  Labs:  Recent Labs  04/01/12 0605  HGB 10.5*  HCT 30.9*  PLT 238  LABPROT 14.3  INR 1.13  CREATININE 0.68    Estimated Creatinine Clearance: 88.2 ml/min (by C-G formula based on Cr of 0.68).  Assessment: 57 y/o female on  Coumadin s/p R TKA for VTE prophylaxis x1 month. INR is baseline after first dose of 7.5 mg as expected.  Coumadin score is 6. Post-op Hg down to 10.5 from 13.7 2nd ALBA.  No overt bleeding reported.   Goal of Therapy:  INR ~2 per MD request Monitor platelets by anticoagulation protocol: Yes   Plan:  - repeat Coumadin 7.5 mg po tonight -INR daily -Monitor for sign/symptoms of bleeding -Coumadin book given 2/7  and video scheduled for this evening -likely home Sunday w/ HH Herby Abraham, Pharm.D. [phone] 04/01/2012 12:15 PM

## 2012-04-01 NOTE — Progress Notes (Signed)
Physical Therapy Treatment Patient Details Name: Teresa [name] MRN: [id number] DOB: 09-Oct-1955 Today's Date: 04/01/2012 Time: 1339-1410 PT Time Calculation (min): 31 min  PT Assessment / Plan / Recommendation Comments on Treatment Session       Follow Up Recommendations  Home health PT;Supervision - Intermittent     Does the patient have the potential to tolerate intense rehabilitation     Barriers to Discharge None      Equipment Recommendations  None recommended by PT    Recommendations for Other Services    Frequency 7X/week   Plan Discharge plan remains appropriate;Frequency remains appropriate    Precautions / Restrictions Precautions Precautions: Knee Precaution Comments: reviewed no pillow under knee Restrictions Weight Bearing Restrictions: Yes RLE Weight Bearing: Weight bearing as tolerated   Pertinent Vitals/Pain no apparent distress     Mobility  Bed Mobility Bed Mobility: Not assessed (up in chair) Transfers Transfers: Sit to Stand;Stand to Sit Sit to Stand: 4: Min guard;From toilet;With armrests;From bed Stand to Sit: 4: Min guard;With upper extremity assist;To bed;To toilet Details for Transfer Assistance: Cues for hand placement Ambulation/Gait Ambulation/Gait Assistance: 4: Min guard Ambulation Distance (Feet): 150 Feet Assistive device: Rolling walker Ambulation/Gait Assistance Details: cues for sequency and posture Gait Pattern: Step-to pattern;Trunk flexed;Decreased step length - right;Decreased stance time - right Gait velocity: decreased Stairs: No Wheelchair Mobility Wheelchair Mobility: No    Exercises Total Joint Exercises Ankle Circles/Pumps: AROM;Both;10 reps Quad Sets: AROM;Right;10 reps Heel Slides: AAROM;Right;10 reps Hip ABduction/ADduction: AAROM;Right;10 reps Straight Leg Raises: AAROM;Right;10 reps Long Arc Quad: AAROM;Right;10 reps   PT Diagnosis: Difficulty walking;Abnormality of gait;Acute pain  PT Problem List:  Decreased strength;Decreased range of motion;Decreased activity tolerance;Decreased mobility;Decreased knowledge of use of DME;Decreased knowledge of precautions;Pain PT Treatment Interventions: DME instruction;Gait training;Stair training;Functional mobility training;Therapeutic activities;Therapeutic exercise;Balance training;Neuromuscular re-education;Patient/family education   PT Goals Acute Rehab PT Goals PT Goal Formulation: With patient Time For Goal Achievement: 04/08/12 Potential to Achieve Goals: Good Pt will go Supine/Side to Sit: with modified independence PT Goal: Supine/Side to Sit - Progress: Goal set today Pt will go Sit to Supine/Side: with modified independence PT Goal: Sit to Supine/Side - Progress: Progressing toward goal Pt will go Sit to Stand: with modified independence PT Goal: Sit to Stand - Progress: Progressing toward goal Pt will go Stand to Sit: with modified independence PT Goal: Stand to Sit - Progress: Progressing toward goal Pt will Ambulate: >150 feet;with modified independence;with rolling walker PT Goal: Ambulate - Progress: Progressing toward goal Pt will Go Up / Down Stairs: 3-5 stairs;with rail(s);with supervision PT Goal: Up/Down Stairs - Progress: Goal set today Pt will Perform Home Exercise Program: with supervision, verbal cues required/provided PT Goal: Perform Home Exercise Program - Progress: Progressing toward goal  Visit Information  Last PT Received On: 04/01/12 Assistance Needed: +1    Subjective Data  Subjective: pt already up in chair this morning Patient Stated Goal: return home   Cognition  Cognition Overall Cognitive Status: Appears within functional limits for tasks assessed/performed Arousal/Alertness: Awake/alert Orientation Level: Appears intact for tasks assessed Behavior During Session: [facility] for tasks performed    Balance  Balance Balance Assessed: Yes Dynamic Standing Balance Dynamic Standing - Balance Support:  Bilateral upper extremity supported;During functional activity Dynamic Standing - Level of Assistance: 4: Min assist  End of Session PT - End of Session Equipment Utilized During Treatment: Gait belt Activity Tolerance: Patient tolerated treatment well Patient left: in bed;with call bell/phone within reach Nurse Communication: Mobility status CPM  Right Knee CPM Right Knee: Off   GP     Fredrich Birks 04/01/2012, 2:03 PM

## 2012-04-01 NOTE — Progress Notes (Signed)
Pt doing well 1 day PO R TKR, minimal px, eager to have drain and IV out and begin PT. In good spirits and without complaint.  BP 132/72  Pulse 87  Temp(Src) 98.7 F (37.1 C) (Oral)  Resp 18  Ht 5\' 7"  (1.702 m)  Wt 87.544 kg (193 lb)  BMI 30.22 kg/m2  SpO2 99%  Pt laying comfortably in hospital bed, drain in place in R knee, bandage appropriate CDI, upon removal of bandage incision is healing well, no signs of infection, 2+ DPP, neurovascularly intact.  CBC    Component Value Date/Time   WBC 14.5* 04/01/2012 0605   RBC 3.67* 04/01/2012 0605   HGB 10.5* 04/01/2012 0605   HCT 30.9* 04/01/2012 0605   PLT 238 04/01/2012 0605   MCV 84.2 04/01/2012 0605   MCH 28.6 04/01/2012 0605   MCHC 34.0 04/01/2012 0605   RDW 15.1 04/01/2012 0605   LYMPHSABS 2.3 03/24/2012 1134   MONOABS 0.6 03/24/2012 1134   EOSABS 0.1 03/24/2012 1134   BASOSABS 0.1 03/24/2012 1134    1 Day PO R TKR doing well   -Px well controlled  -Drain removed today, there was slight difficulty removing secondary to catching poly lining   -Bandage replaced after drain removed  -PT to work with pt today, she is eager and driven regarding this  -FWB as tolerated  -Coumadin x1mo per pharmacy and to be managed by [facility] on discharge   -Likely home Sunday pending PT and px control

## 2012-04-01 NOTE — Progress Notes (Signed)
Physical Therapy Evaluation Patient Details Name: Teresa [name] MRN: [id number] DOB: 1955-12-05 Today's Date: 04/01/2012 Time: 7829-5621 PT Time Calculation (min): 19 min  PT Assessment / Plan / Recommendation Clinical Impression  Pt is 57 yo female s/p right TKA who is mobilizing well this morning. WIll benefit from acute PT to increase strength and ROM of the right knee as well as increase functional mobility and independence. Recommend HHPT at d/c.    PT Assessment  Patient needs continued PT services    Follow Up Recommendations  Home health PT;Supervision - Intermittent    Does the patient have the potential to tolerate intense rehabilitation      Barriers to Discharge None      Equipment Recommendations  None recommended by PT    Recommendations for Other Services     Frequency 7X/week    Precautions / Restrictions Precautions Precautions: Knee Precaution Comments: reviewed no pillow under knee Restrictions Weight Bearing Restrictions: Yes RLE Weight Bearing: Weight bearing as tolerated   Pertinent Vitals/Pain 8/10 right knee pain after session, premedicated      Mobility  Bed Mobility Bed Mobility: Not assessed (up in chair) Transfers Transfers: Sit to Stand;Stand to Sit Sit to Stand: 4: Min guard;From chair/3-in-1;With upper extremity assist Stand to Sit: 4: Min guard;To chair/3-in-1;With upper extremity assist Details for Transfer Assistance: vc's for hand placement Ambulation/Gait Ambulation/Gait Assistance: 4: Min assist Ambulation Distance (Feet): 20 Feet Assistive device: Rolling walker Ambulation/Gait Assistance Details: vc's for sequencing Gait Pattern: Step-to pattern;Decreased step length - right;Decreased stance time - right Gait velocity: decreased Stairs: No Wheelchair Mobility Wheelchair Mobility: No    Exercises Total Joint Exercises Ankle Circles/Pumps: AROM;10 reps;Seated;Both Quad Sets: AROM;10 reps;Seated;Both Heel Slides:  AROM;Seated;10 reps;Right Hip ABduction/ADduction: AROM;Right;10 reps;Seated Straight Leg Raises: AAROM;Right;10 reps;Seated Long Arc Quad: AROM;Right;10 reps;Seated   PT Diagnosis: Difficulty walking;Abnormality of gait;Acute pain  PT Problem List: Decreased strength;Decreased range of motion;Decreased activity tolerance;Decreased mobility;Decreased knowledge of use of DME;Decreased knowledge of precautions;Pain PT Treatment Interventions: DME instruction;Gait training;Stair training;Functional mobility training;Therapeutic activities;Therapeutic exercise;Balance training;Neuromuscular re-education;Patient/family education   PT Goals Acute Rehab PT Goals PT Goal Formulation: With patient Time For Goal Achievement: 04/08/12 Potential to Achieve Goals: Good Pt will go Supine/Side to Sit: with modified independence PT Goal: Supine/Side to Sit - Progress: Goal set today Pt will go Sit to Supine/Side: with modified independence PT Goal: Sit to Supine/Side - Progress: Goal set today Pt will go Sit to Stand: with modified independence PT Goal: Sit to Stand - Progress: Goal set today Pt will go Stand to Sit: with modified independence PT Goal: Stand to Sit - Progress: Goal set today Pt will Ambulate: >150 feet;with modified independence;with rolling walker PT Goal: Ambulate - Progress: Goal set today Pt will Go Up / Down Stairs: 3-5 stairs;with rail(s);with supervision PT Goal: Up/Down Stairs - Progress: Goal set today Pt will Perform Home Exercise Program: with supervision, verbal cues required/provided PT Goal: Perform Home Exercise Program - Progress: Goal set today  Visit Information  Last PT Received On: 04/01/12 Assistance Needed: +1    Subjective Data  Subjective: pt already up in chair this morning Patient Stated Goal: return home   Prior Functioning  Home Living Lives With: Spouse Available Help at Discharge: Family;Available 24 hours/day Type of Home: House Home Access:  Stairs to enter [employer] of Steps: 3 Entrance Stairs-Rails: Right Home Layout: One level Home Adaptive Equipment: Bedside commode/3-in-1;Walker - rolling Additional Comments: pt's husband is taking a week off work to  be home with her Prior Function Level of Independence: Independent Able to Take Stairs?: Yes Driving: Yes Vocation: Workers comp Comments: 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 Communication Communication: No difficulties    Copywriter, advertising Overall Cognitive Status: Appears within functional limits for tasks assessed/performed Arousal/Alertness: Awake/alert Orientation Level: Oriented X4 / Intact Behavior During Session: [facility] for tasks performed    Extremity/Trunk Assessment Right Upper Extremity Assessment RUE ROM/Strength/Tone: WFL for tasks assessed RUE Sensation: WFL - Light Touch RUE Coordination: WFL - gross motor Left Upper Extremity Assessment LUE ROM/Strength/Tone: WFL for tasks assessed LUE Sensation: WFL - Light Touch LUE Coordination: WFL - gross motor Right Lower Extremity Assessment RLE ROM/Strength/Tone: Deficits;Unable to fully assess;Due to pain RLE ROM/Strength/Tone Deficits: quad 2-/5, hip flex 2+/5 RLE Sensation: WFL - Light Touch RLE Coordination: WFL - gross motor Left Lower Extremity Assessment LLE ROM/Strength/Tone: WFL for tasks assessed LLE Sensation: WFL - Light Touch;WFL - Proprioception LLE Coordination: WFL - gross motor Trunk Assessment Trunk Assessment: Normal   Balance Balance Balance Assessed: Yes Dynamic Standing Balance Dynamic Standing - Balance Support: Bilateral upper extremity supported;During functional activity Dynamic Standing - Level of Assistance: 4: Min assist  End of Session PT - End of Session Equipment Utilized During Treatment: Gait belt Activity Tolerance: Patient tolerated treatment well Patient left: in chair;with call bell/phone within reach Nurse Communication: Mobility  status CPM Right Knee CPM Right Knee: Off  GP    Lyanne Co, PT  Acute Rehab Services  [phone]  Lyanne Co 04/01/2012, 11:01 AM

## 2012-04-02 LAB — CBC
Hemoglobin: 9.6 g/dL — ABNORMAL LOW (ref 12.0–15.0)
MCH: 29.3 pg (ref 26.0–34.0)
MCHC: 34.3 g/dL (ref 30.0–36.0)
RDW: 15.6 % — ABNORMAL HIGH (ref 11.5–15.5)

## 2012-04-02 LAB — PROTIME-INR: Prothrombin Time: 15.5 seconds — ABNORMAL HIGH (ref 11.6–15.2)

## 2012-04-02 MED ORDER — WARFARIN SODIUM 7.5 MG PO TABS
7.5000 mg | ORAL_TABLET | Freq: Once | ORAL | Status: AC
  Administered 2012-04-02: 7.5 mg via ORAL
  Filled 2012-04-02: qty 1

## 2012-04-02 MED ORDER — OXYCODONE-ACETAMINOPHEN 5-325 MG PO TABS
1.0000 | ORAL_TABLET | ORAL | Status: DC | PRN
  Administered 2012-04-02 – 2012-04-03 (×5): 2 via ORAL
  Filled 2012-04-02 (×5): qty 2

## 2012-04-02 MED ORDER — OXYCODONE-ACETAMINOPHEN 5-325 MG PO TABS: 1.0000 | ORAL_TABLET | ORAL | Status: DC | PRN

## 2012-04-02 NOTE — Progress Notes (Signed)
Pt doing well 2 days PO R TKR by Dr. Luiz Blare, moderate px, was up with PT yesterday with good gains but slight increase in pain. In good spirits today but nervous about going home today secondary to pain and minimal PT.    BP 137/74  Pulse 86  Temp(Src) 99.3 F (37.4 C) (Oral)  Resp 18  Ht 5\' 7"  (1.702 m)  Wt 87.544 kg (193 lb)  BMI 30.22 kg/m2  SpO2 99%   Pt laying comfortably in hospital bed, bandage appropriate CDI, neg homans, SCD's in place, 2+ DPP, neurovascularly intact.   CBC    Component Value Date/Time   WBC 11.5* 04/02/2012 0500   RBC 3.28* 04/02/2012 0500   HGB 9.6* 04/02/2012 0500   HCT 28.0* 04/02/2012 0500   PLT 203 04/02/2012 0500   MCV 85.4 04/02/2012 0500   MCH 29.3 04/02/2012 0500   MCHC 34.3 04/02/2012 0500   RDW 15.6* 04/02/2012 0500   LYMPHSABS 2.3 03/24/2012 1134   MONOABS 0.6 03/24/2012 1134   EOSABS 0.1 03/24/2012 1134   BASOSABS 0.1 03/24/2012 1134   PT: 15.5. INR: 1.25  2 Days PO R TKR doing well, slight increase in px with PT nervous about going home today  -Px moderately controlled on percocet, pt waiting a long time between doses, instructed to request before px      severe   -PT to cont work with pt today, she is eager and driven regarding this   -FWB as tolerated   -Coumadin x33mo per pharmacy and to be managed by [facility] on discharge   -Likely home Monday pending PT and px control, could go home later today if pt willing   -d/c on Robaxin and percocet

## 2012-04-02 NOTE — Progress Notes (Signed)
Physical Therapy Treatment Patient Details Name: Teresa [name] MRN: [id number] DOB: August 29, 1955 Today's Date: 04/02/2012 Time: 1210-1233 PT Time Calculation (min): 23 min  PT Assessment / Plan / Recommendation Comments on Treatment Session  Pt very pleasant & willing to participate.  Initiated stair training this session but would benefit from reviewing/practicing again before d/cing home.       Follow Up Recommendations  Home health PT;Supervision - Intermittent     Does the patient have the potential to tolerate intense rehabilitation     Barriers to Discharge        Equipment Recommendations  None recommended by PT    Recommendations for Other Services    Frequency 7X/week   Plan Discharge plan remains appropriate;Frequency remains appropriate    Precautions / Restrictions Precautions Precautions: Knee Restrictions RLE Weight Bearing: Weight bearing as tolerated   Pertinent Vitals/Pain 9/10 Rt knee.  RN notified & administered pain medication before ambulating into hallway.      Mobility  Bed Mobility Bed Mobility: Supine to Sit;Sitting - Scoot to Edge of Bed;Sit to Supine Supine to Sit: 6: Modified independent (Device/Increase time);HOB flat Sitting - Scoot to Edge of Bed: 6: Modified independent (Device/Increase time) Sit to Supine: 4: Min assist;HOB flat Details for Bed Mobility Assistance: (A) to lift RLE into bed Transfers Transfers: Sit to Stand;Stand to Sit Sit to Stand: 4: Min guard;With upper extremity assist;From bed Stand to Sit: 4: Min guard;With upper extremity assist;To bed Details for Transfer Assistance: Cues for hand placement & R LE positioning before sitting Ambulation/Gait Ambulation/Gait Assistance: 4: Min guard Ambulation Distance (Feet): 80 Feet Assistive device: Rolling walker Ambulation/Gait Assistance Details: Cues for tall posture & encouragement to decrease reliance of UE's on RW Gait Pattern: Step-to pattern;Decreased stance time -  right;Decreased step length - left;Decreased weight shift to right Stairs: Yes Stairs Assistance: 4: Min assist Stairs Assistance Details (indicate cue type and reason): (A) for balance & safety.   Pt hesitant with increased WBing through RLE to allow sufficient step clearance of L LE with ascending steps.   Stair Management Technique: Two rails;Step to pattern;Forwards Number of Stairs: 3 (2x's) Wheelchair Mobility Wheelchair Mobility: No    Exercises Total Joint Exercises Ankle Circles/Pumps: AROM;Both;10 reps Quad Sets: AROM;Both;10 reps Hip ABduction/ADduction: AAROM;Right;10 reps Straight Leg Raises: AAROM;Right;10 reps   PT Diagnosis:    PT Problem List:   PT Treatment Interventions:     PT Goals Acute Rehab PT Goals Time For Goal Achievement: 04/08/12 Potential to Achieve Goals: Good Pt will go Supine/Side to Sit: with modified independence PT Goal: Supine/Side to Sit - Progress: Met Pt will go Sit to Supine/Side: with modified independence PT Goal: Sit to Supine/Side - Progress: Progressing toward goal Pt will go Sit to Stand: with modified independence PT Goal: Sit to Stand - Progress: Progressing toward goal Pt will go Stand to Sit: with modified independence PT Goal: Stand to Sit - Progress: Progressing toward goal Pt will Ambulate: >150 feet;with modified independence;with rolling walker PT Goal: Ambulate - Progress: Progressing toward goal Pt will Go Up / Down Stairs: 3-5 stairs;with rail(s);with supervision PT Goal: Up/Down Stairs - Progress: Progressing toward goal Pt will Perform Home Exercise Program: with supervision, verbal cues required/provided PT Goal: Perform Home Exercise Program - Progress: Progressing toward goal  Visit Information  Last PT Received On: 04/02/12 Assistance Needed: +1    Subjective Data  Subjective: "Im busting out of here tomorrow" Patient Stated Goal: return home   Cognition  Cognition  Overall Cognitive Status: Appears within  functional limits for tasks assessed/performed Arousal/Alertness: Awake/alert Orientation Level: Appears intact for tasks assessed Behavior During Session: [facility] for tasks performed    Balance  Balance Balance Assessed: No  End of Session PT - End of Session Equipment Utilized During Treatment: Gait belt Activity Tolerance: Patient tolerated treatment well Patient left: in bed;with call bell/phone within reach Nurse Communication: Mobility status     [name], [place] [phone] 04/02/2012

## 2012-04-02 NOTE — Progress Notes (Signed)
[facility]  Patient Status: New  [facility] is providing the following services: RN and PT.  Referral from MD office.  Services and equipment have been approved by agreement with  workers comp [employer] and [facility]  If patient discharges after hours, please call [phone].   Jodene Nam 04/02/2012, 7:48 PM

## 2012-04-02 NOTE — Progress Notes (Signed)
ANTICOAGULATION CONSULT NOTE - follow up Pharmacy Consult for Coumadin Indication: VTE prophylaxis  Allergies  Allergen Reactions  . Ace Inhibitors Swelling    Angioedema    Patient Measurements: Height: 5\' 7"  (170.2 cm) Weight: 193 lb (87.544 kg) IBW/kg (Calculated) : 61.6  Vital Signs: Temp: 99.3 F (37.4 C) (02/09 0655) BP: 137/74 mmHg (02/09 0655) Pulse Rate: 86 (02/09 0655)  Labs:  Recent Labs  04/01/12 0605 04/02/12 0500  HGB 10.5* 9.6*  HCT 30.9* 28.0*  PLT 238 203  LABPROT 14.3 15.5*  INR 1.13 1.25  CREATININE 0.68  --     Estimated Creatinine Clearance: 88.2 ml/min (by C-G formula based on Cr of 0.68).  Assessment: 57 y/o female on  Coumadin s/p R TKA for VTE prophylaxis x1 month. INR 1.25 after 2 doses of 7.5 mg, Coumadin score is 6. Post-op Hg down to 9.6 from 13.7 2nd ALBA.  No overt bleeding reported.  Pt educated about coumadin on 04/01/12.    Goal of Therapy:  INR ~2 per MD request Monitor platelets by anticoagulation protocol: Yes   Plan:  - repeat Coumadin 7.5 mg po tonight -INR daily -Monitor for sign/symptoms of bleeding -likely home Monday w/ HH to f/u coumadin x 1 month Herby Abraham, Pharm.D. [phone] 04/02/2012 1:50 PM

## 2012-04-03 ENCOUNTER — Encounter (HOSPITAL_COMMUNITY): Payer: Self-pay | Admitting: Orthopedic Surgery

## 2012-04-03 LAB — CBC
MCH: 28.4 pg (ref 26.0–34.0)
MCV: 85.4 fL (ref 78.0–100.0)
Platelets: 211 10*3/uL (ref 150–400)
RBC: 3.56 MIL/uL — ABNORMAL LOW (ref 3.87–5.11)
RDW: 15.6 % — ABNORMAL HIGH (ref 11.5–15.5)

## 2012-04-03 LAB — PROTIME-INR: Prothrombin Time: 19.8 seconds — ABNORMAL HIGH (ref 11.6–15.2)

## 2012-04-03 NOTE — Progress Notes (Signed)
Seen and agreed 04/03/2012 Isha Seefeld Elizabeth PTA [phone] pager [phone] office    

## 2012-04-03 NOTE — Progress Notes (Signed)
Physical Therapy Treatment Patient Details Name: Teresa [name] MRN: [id number] DOB: [birth year] Today's Date: 04/03/2012 Time: 0865-7846 PT Time Calculation (min): 27 min  PT Assessment / Plan / Recommendation Comments on Treatment Session  Continued stair training this session with increased foot clearance. Increased exerices with only mild discomfort.  Will attempt further distance later today. Pt plan to D/C home today.     Follow Up Recommendations  Home health PT;Supervision - Intermittent     Does the patient have the potential to tolerate intense rehabilitation     Barriers to Discharge        Equipment Recommendations  None recommended by PT    Recommendations for Other Services    Frequency 7X/week   Plan Discharge plan remains appropriate;Frequency remains appropriate    Precautions / Restrictions Precautions Precautions: Knee Precaution Comments: KI discontinued per PA  Restrictions RLE Weight Bearing: Weight bearing as tolerated   Pertinent Vitals/Pain 6/10 pain/ RN made aware.    Mobility  Bed Mobility Supine to Sit: 6: Modified independent (Device/Increase time) Sitting - Scoot to Edge of Bed: 6: Modified independent (Device/Increase time) Details for Bed Mobility Assistance: No physical assistance required this session. Transfers Sit to Stand: 4: Min guard;With upper extremity assist;From bed Stand to Sit: 4: Min guard;With upper extremity assist;To chair/3-in-1 Ambulation/Gait Ambulation/Gait Assistance: 4: Min guard Ambulation Distance (Feet): 90 Feet Assistive device: Rolling walker Ambulation/Gait Assistance Details: Cueing for upright posture  Gait Pattern: Step-to pattern;Decreased stance time - right;Decreased step length - left General Gait Details: Pt reports almost equal WBing on LEs Stairs: Yes Stairs Assistance: 4: Min guard Stairs Assistance Details (indicate cue type and reason): Cueing for sequence. Min Guard for safety.  Stair  Management Technique: Two rails;Step to pattern;Forwards Number of Stairs: 3 (x2 trials )    Exercises Total Joint Exercises Ankle Circles/Pumps: AROM;Both;10 reps Heel Slides: AAROM;Right;10 reps Hip ABduction/ADduction: AROM;Right;10 reps Straight Leg Raises: AAROM;Right;10 reps Long Arc Quad: AROM;Limitations Long Arc Quad Limitations: decreased ROM Knee Flexion: AAROM;Right;15 reps   PT Diagnosis:    PT Problem List:   PT Treatment Interventions:     PT Goals Acute Rehab PT Goals PT Goal: Supine/Side to Sit - Progress: Met PT Goal: Sit to Stand - Progress: Progressing toward goal PT Goal: Stand to Sit - Progress: Progressing toward goal PT Goal: Ambulate - Progress: Progressing toward goal PT Goal: Up/Down Stairs - Progress: Met PT Goal: Perform Home Exercise Program - Progress: Progressing toward goal  Visit Information  Last PT Received On: 04/03/12 Assistance Needed: +1    Subjective Data  Subjective: "Im going home today"   Cognition  Cognition Overall Cognitive Status: Appears within functional limits for tasks assessed/performed Arousal/Alertness: Awake/alert Orientation Level: Appears intact for tasks assessed Behavior During Session: [facility] for tasks performed    Balance     End of Session PT - End of Session Equipment Utilized During Treatment: Gait belt;Right knee immobilizer Activity Tolerance: Patient tolerated treatment well Patient left: in chair;with nursing in room Nurse Communication: Mobility status   GP     Lazaro Arms 04/03/2012, 8:30 AM

## 2012-04-03 NOTE — Progress Notes (Signed)
UR COMPLETED  

## 2012-04-03 NOTE — Progress Notes (Signed)
Physical Therapy Treatment Patient Details Name: Teresa [name] MRN: [id number] DOB: Jun 13, 1955 Today's Date: 04/03/2012 Time: 7829-5621 PT Time Calculation (min): 12 min  PT Assessment / Plan / Recommendation Comments on Treatment Session  Long hall ambulation and stair training without knee immbolizer this session. No noted buckling or loss of balance. Reviewed exercise handout. Pt comfortable with steps and mobility with husband support.    Follow Up Recommendations  Home health PT;Supervision - Intermittent     Does the patient have the potential to tolerate intense rehabilitation     Barriers to Discharge        Equipment Recommendations  None recommended by PT    Recommendations for Other Services    Frequency 7X/week   Plan Discharge plan remains appropriate;Frequency remains appropriate    Precautions / Restrictions Precautions Precautions: Knee Restrictions RLE Weight Bearing: Weight bearing as tolerated       Mobility  Transfers Sit to Stand: 5: Supervision;With upper extremity assist;From chair/3-in-1 Stand to Sit: 4: Min guard;With upper extremity assist;To chair/3-in-1 Details for Transfer Assistance: Cues for backing and extending RLE when sitting Ambulation/Gait Ambulation/Gait Assistance: 4: Min guard Ambulation Distance (Feet): 115 Feet Assistive device: Rolling walker Gait Pattern: Step-to pattern;Decreased stance time - right;Decreased step length - left Stairs Assistance: 4: Min guard Stairs Assistance Details (indicate cue type and reason): MIn guard for safety Stair Management Technique: Two rails;Step to pattern;Forwards Number of Stairs: 2 (x2)    Exercises     PT Diagnosis:    PT Problem List:   PT Treatment Interventions:     PT Goals Acute Rehab PT Goals PT Goal: Sit to Stand - Progress: Progressing toward goal PT Goal: Stand to Sit - Progress: Progressing toward goal PT Goal: Ambulate - Progress: Progressing toward goal PT Goal:  Up/Down Stairs - Progress: Met PT Goal: Perform Home Exercise Program - Progress: Progressing toward goal  Visit Information  Last PT Received On: 04/03/12 Assistance Needed: +1    Subjective Data      Cognition  Cognition Overall Cognitive Status: Appears within functional limits for tasks assessed/performed Arousal/Alertness: Awake/alert Orientation Level: Appears intact for tasks assessed Behavior During Session: [facility] for tasks performed    Balance     End of Session PT - End of Session Equipment Utilized During Treatment: Gait belt Activity Tolerance: Patient tolerated treatment well Patient left: in chair;with call bell/phone within reach;with family/visitor present Nurse Communication: Mobility status   GP     Lazaro Arms 04/03/2012, 1:43 PM

## 2012-04-03 NOTE — Progress Notes (Signed)
D/C instructions done and scripts given. pts husband at bedside to take pt home.

## 2012-04-03 NOTE — Progress Notes (Signed)
CARE MANAGEMENT NOTE 04/03/2012  Patient:  Teresa [name], Teresa [name]   Account Number:  [id number]  Date Initiated:  04/03/2012  Documentation initiated by:  Vance Peper  Subjective/Objective Assessment:   Right total knee     Action/Plan:   home with [facility] services  patient under worker's comp. Was preoperatively setup with [facility]. Has DME.   Anticipated DC Date:  04/03/2012   Anticipated DC Plan:  HOME W HOME HEALTH SERVICES      DC Planning Services  CM consult      [facility] Choice  HOME HEALTH   Choice offered to / List presented to:          [facility] arranged  HH-2 PT      Status of service:  Completed, signed off Medicare Important Message given?   (If response is "NO", the following Medicare IM given date fields will be blank) Date Medicare IM given:   Date Additional Medicare IM given:    Discharge Disposition:  HOME W HOME HEALTH SERVICES  Per UR Regulation:    If discussed at Long Length of Stay Meetings, dates discussed:    Comments:  04/03/12 10:22 Anette Guarneri Rn/CM Workers [employer] ph# [phone] ID# [id number] DOI 11/05/2010

## 2012-04-03 NOTE — Discharge Summary (Signed)
Patient ID: MARCELE KOSTA MRN: [id number] DOB/AGE: August 22, 1955 57 y.o.  Admit date: 03/31/2012 Discharge date: 04/03/2012  Admission Diagnoses:  Principal Problem:   Osteoarthritis of right knee   Discharge Diagnoses:  Same  Past Medical History  Diagnosis Date  . Acute non Q wave MI (myocardial infarction), subsequent episode   . CVA (cerebral infarction)   . Hyperlipidemia   . Hypertension   . Angioedema   . Stroke   . Bronchitis   . Arthritis     Surgeries: Procedure(s):Right COMPUTER ASSISTED TOTAL KNEE ARTHROPLASTY on 03/31/2012   Discharged Condition: Improved  Hospital Course: ALVETA QUINTELA is an 57 y.o. female who was admitted 03/31/2012 for operative treatment ofOsteoarthritis of right knee. Patient has severe unremitting pain that affects sleep, daily activities, and work/hobbies. After pre-op clearance the patient was taken to the operating room on 03/31/2012 and underwent  Procedure(s):Right COMPUTER ASSISTED TOTAL KNEE ARTHROPLASTY.    Patient was given perioperative antibiotics: Anti-infectives   Start     Dose/Rate Route Frequency Ordered Stop   03/31/12 2000  ceFAZolin (ANCEF) IVPB 2 g/50 mL premix     2 g 100 mL/hr over 30 Minutes Intravenous Every 6 hours 03/31/12 1937 04/01/12 0236   03/31/12 1359  cefUROXime (ZINACEF) injection  Status:  Discontinued       As needed 03/31/12 1359 03/31/12 1553   03/31/12 0600  ceFAZolin (ANCEF) IVPB 2 g/50 mL premix     2 g 100 mL/hr over 30 Minutes Intravenous On call to O.R. 03/30/12 1420 03/31/12 1330       Patient was given sequential compression devices, early ambulation, and chemoprophylaxis to prevent DVT.She needed an additional day for pain cotrol and PT to insure pt safety at home.  Patient benefited maximally from hospital stay and there were no complications.    Recent vital signs: Patient Vitals for the past 24 hrs:  BP Temp Pulse Resp SpO2  04/03/12 0532 138/73 mmHg 98.3 F (36.8 C) 84 18 100 %  04/02/12  2131 136/71 mmHg 98.8 F (37.1 C) 95 18 100 %  04/02/12 1456 135/66 mmHg 99.2 F (37.3 C) 91 18 100 %     Recent laboratory studies:  Recent Labs  04/01/12 0605 04/02/12 0500  WBC 14.5* 11.5*  HGB 10.5* 9.6*  HCT 30.9* 28.0*  PLT 238 203  NA 135  --   K 3.8  --   CL 100  --   CO2 24  --   BUN 11  --   CREATININE 0.68  --   GLUCOSE 153*  --   INR 1.13 1.25  CALCIUM 8.5  --      Discharge Medications:     Medication List    TAKE these medications       amLODipine 10 MG tablet  Commonly known as:  NORVASC  Take 1 tablet (10 mg total) by mouth daily.     atorvastatin 10 MG tablet  Commonly known as:  LIPITOR  Take 1 tablet (10 mg total) by mouth daily.     carvedilol 12.5 MG tablet  Commonly known as:  COREG  Take 6.25 mg by mouth 2 (two) times daily with a meal.     methocarbamol 750 MG tablet  Commonly known as:  ROBAXIN-750  One every eight hours as needed for spasm.     oxyCODONE-acetaminophen 5-325 MG per tablet  Commonly known as:  PERCOCET/ROXICET  Take 1-2 tablets by mouth every 4 (four) hours as needed for  pain.     warfarin 5 MG tablet  Commonly known as:  COUMADIN  One tablet daily unless otherwise directed. Treat for one month. Shoot for INR of 2.0.        Diagnostic Studies: Dg Chest 2 View  03/24/2012  *RADIOLOGY REPORT*  Clinical Data: Preoperative evaluation for knee replacement. History of hypertension, stroke and myocardial infarction.  Ex- smoker  CHEST - 2 VIEW  Comparison: 12/30/2009  Findings: Heart and mediastinal contours are within normal limits and stable.  The lung fields demonstrate stable mild increase in central peribronchial cuffing compatible with underlying bronchitic change and history of smoking.  No new focal infiltrates or signs of congestive failure are seen. No pleural fluid is noted  Bony structures appear intact with screw plate fixation of the cervical spine again identified.  IMPRESSION: Stable cardiopulmonary  appearance with no new focal or acute abnormality seen   Original Report Authenticated By: Rhodia Albright, M.D.    Nm Myocar Multi W/spect W/wall Motion / Ef  03/21/2012  *RADIOLOGY REPORT*  Clinical data: Chest pain, hypertension.  NUCLEAR MEDICINE MYOCARDIAL PERFUSION IMAGING NUCLEAR MEDICINE LEFT VENTRICULAR WALL MOTION ANALYSIS NUCLEAR MEDICINE LEFT VENTRICULAR EJECTION FRACTION CALCULATION  Technique: Standard single day myocardial SPECT imaging was performed after resting intravenous injection of Tc-87m sestamibi. After intravenous infusion of Lexiscan (regadenoson) under supervision of cardiology staff, sestamibiwas injected intravenously and standard myocardial SPECT imaging was performed. Quantitative gated imaging was also performed to evaluate left ventricular wall motion and estimate left ventricular ejection fraction.  Radiopharmaceutical: 10+30 mCi Tc73m sestamibiIV.  Comparison: 07/19/2007  Findings:    The stress SPECT images demonstrate physiologic distribution of radiopharmaceutical. Rest images demonstrate no perfusion defects. The gated stress SPECT images demonstrate normal left ventricular myocardial thickening.  No focal wall motion abnormality is seen. Calculated left ventricular end-diastolic volume 91ml, end-systolic volume 43ml, ejection fraction of 52%.  IMPRESSION:  1. Negative for pharmacologic-stress induced ischemia.  2. Left ventricular ejection fraction 52%.   Original Report Authenticated By: D. Andria Rhein, MD     Disposition: 01-Home or Self Care      Discharge Orders   Future Appointments Provider Department Dept Phone   04/19/2012 11:00 AM Nestor Ramp, MD [facility] [phone]   Future Orders Complete By Expires     CPM  As directed     Comments:      Continuous passive motion machine (CPM):      Use the CPM from 0 degrees to 60 degrees for6-8 hours per day.      You may increase by 10 degrees per day.  You may break it up into 2 or 3  sessions per day.      Use CPM for 1-2 weeks or until you are told to stop.    Call MD / Call 911  As directed     Comments:      If you experience chest pain or shortness of breath, CALL 911 and be transported to the hospital emergency room.  If you develope a fever above 101 F, pus (white drainage) or increased drainage or redness at the wound, or calf pain, call your surgeon's office.    Constipation Prevention  As directed     Comments:      Drink plenty of fluids.  Prune juice may be helpful.  You may use a stool softener, such as Colace (over the counter) 100 mg twice a day.  Use MiraLax (over the counter) for constipation  as needed.    Diet general  As directed     Do not put a pillow under the knee. Place it under the heel.  As directed     Increase activity slowly as tolerated  As directed     Weight bearing as tolerated  As directed        Follow-up Information   Follow up with GRAVES,JOHN L, MD. Schedule an appointment as soon as possible for a visit in 2 weeks.   Contact information:   [address] [place] [address] [phone]        Signed: Matthew Folks 04/03/2012, 8:18 AM

## 2012-04-03 NOTE — Progress Notes (Signed)
Subjective: 3 Days Post-Op Procedure(s) (LRB): COMPUTER ASSISTED TOTAL KNEE ARTHROPLASTY (Right) Patient reports pain as 6 on 0-10 scale.   Taking po/voiding ok.  Objective: Vital signs in last 24 hours: Temp:  [98.3 F (36.8 C)-99.2 F (37.3 C)] 98.3 F (36.8 C) (02/10 0532) Pulse Rate:  [84-95] 84 (02/10 0532) Resp:  [18] 18 (02/10 0532) BP: (135-138)/(66-73) 138/73 mmHg (02/10 0532) SpO2:  [100 %] 100 % (02/10 0532)  Intake/Output from previous day: 02/09 0701 - 02/10 0700 In: 720 [P.O.:720] Out: 500 [Urine:500] Intake/Output this shift:     Recent Labs  04/01/12 0605 04/02/12 0500  HGB 10.5* 9.6*    Recent Labs  04/01/12 0605 04/02/12 0500  WBC 14.5* 11.5*  RBC 3.67* 3.28*  HCT 30.9* 28.0*  PLT 238 203    Recent Labs  04/01/12 0605  NA 135  K 3.8  CL 100  CO2 24  BUN 11  CREATININE 0.68  GLUCOSE 153*  CALCIUM 8.5    Recent Labs  04/01/12 0605 04/02/12 0500  INR 1.13 1.25  Right knee:  Neurovascular intact Sensation intact distally Intact pulses distally Dorsiflexion/Plantar flexion intact Compartment soft  Assessment/Plan: 3 Days Post-Op Procedure(s) (LRB): COMPUTER ASSISTED TOTAL KNEE ARTHROPLASTY (Right) Plan: Discharge home with home health  Jashua Knaak G 04/03/2012, 8:14 AM

## 2012-04-03 NOTE — Progress Notes (Signed)
Seen and agreed 04/03/2012 Robinette, Julia Elizabeth PTA [phone] pager [phone] office    

## 2012-04-08 ENCOUNTER — Other Ambulatory Visit: Payer: Self-pay

## 2012-04-19 ENCOUNTER — Ambulatory Visit (INDEPENDENT_AMBULATORY_CARE_PROVIDER_SITE_OTHER): Payer: BC Managed Care – PPO | Admitting: Family Medicine

## 2012-04-19 VITALS — BP 150/71 | HR 83 | Temp 99.1°F | Ht 67.0 in | Wt 188.0 lb

## 2012-04-19 DIAGNOSIS — M1710 Unilateral primary osteoarthritis, unspecified knee: Secondary | ICD-10-CM

## 2012-04-19 DIAGNOSIS — I10 Essential (primary) hypertension: Secondary | ICD-10-CM

## 2012-04-19 DIAGNOSIS — I2510 Atherosclerotic heart disease of native coronary artery without angina pectoris: Secondary | ICD-10-CM

## 2012-04-19 DIAGNOSIS — E785 Hyperlipidemia, unspecified: Secondary | ICD-10-CM

## 2012-04-19 DIAGNOSIS — Z96659 Presence of unspecified artificial knee joint: Secondary | ICD-10-CM | POA: Insufficient documentation

## 2012-04-19 DIAGNOSIS — Z96651 Presence of right artificial knee joint: Secondary | ICD-10-CM

## 2012-04-19 NOTE — Assessment & Plan Note (Signed)
Good control on current medication regimen. We will make no changes today. I suspect that her pain level is a little less, after she complete recovery from the knee replacement, her blood pressure will be even better controlled so I don't want to overshoot by increasing her dosage today. I'll see her back in 3 months.

## 2012-04-19 NOTE — Progress Notes (Signed)
  Subjective:    Patient ID: Teresa [name], female    DOB: [birth year], 57 y.o.   MRN: [id number]  HPI  #1. Followup cardiovascular disease with prior history of MI, prior history of stroke in on presentation and control blood pressure. We started her on blood pressure medicines that she is here today for followup. In the interim she has had workup by her cardiologist with a negative Myoview. She has also undergone TKR right knee and doing quite well. She's not having any problems with the medicines. Taking them regularly. Denies chest pain, shortness of breath, lower stream he edema. #2. TKR right knee. Doing extremely well. She is walking with a cane. Pain level is 5/10. She is complete one month of warfarin and she is currently in physical therapy. #3. Hyperlipidemia. LDL was 108 and we have her on 10 mg of Lipitor. No myalgias.  Review of Systems Denies chest pain, shortness of breath, lower extremity edema. See history of present illness above for additional pertinent review of systems    Objective:   Physical Exam Vital signs are reviewed GENERAL: Well-developed female no acute distress KNEE: Right. Nicely healing midline right knee scar. No sign of infection. She has almost full extension now lacking about 15. The calf is soft. Distally she is neurovascularly intact. Cardiovascular: Regular rate and rhythm no murmur gallop or rub LUNGS: Clear to auscultation bilaterally       Assessment & Plan:  NOTE: At the end of her one month of anticoagulation status post TKR, I think we at least need to put her back on her aspirin. It's unclear whether not she would be better off with Plavix given her history of MI CVA. Osteophyte and get some old records on her CVA and see if she was on aspirin when she had it.

## 2012-04-19 NOTE — Assessment & Plan Note (Signed)
Now status post TKR on the right knee. She's completing one month of warfarin. She is doing extremely well.

## 2012-04-19 NOTE — Assessment & Plan Note (Signed)
Doing well on her current dose. I think we'll leave her at that can for right now but would consider maximizing her therapy in the future given the new recommendations in the fact that she is currently asymptomatic. She might be intolerant 20.

## 2012-04-19 NOTE — Assessment & Plan Note (Signed)
Had negative cardiac workup is negative Myoview preoperatively. She has done well since. We'll continue her current beta blocker therapy with carvedilol. She is currently on warfarin for her postop total knee replacement. Once that is complete, she should definitely go back on aspirin. Unclear to me whether not she should go back to something like Plavix instead.

## 2012-04-20 ENCOUNTER — Other Ambulatory Visit: Payer: Self-pay | Admitting: Family Medicine

## 2012-12-28 ENCOUNTER — Other Ambulatory Visit: Payer: Self-pay

## 2013-01-29 ENCOUNTER — Ambulatory Visit (INDEPENDENT_AMBULATORY_CARE_PROVIDER_SITE_OTHER): Payer: BC Managed Care – PPO | Admitting: Sports Medicine

## 2013-01-29 ENCOUNTER — Encounter: Payer: Self-pay | Admitting: Sports Medicine

## 2013-01-29 VITALS — BP 131/88 | HR 89 | Temp 98.1°F | Ht 67.0 in | Wt 195.2 lb

## 2013-01-29 DIAGNOSIS — B349 Viral infection, unspecified: Secondary | ICD-10-CM | POA: Insufficient documentation

## 2013-01-29 DIAGNOSIS — B9789 Other viral agents as the cause of diseases classified elsewhere: Secondary | ICD-10-CM

## 2013-01-29 MED ORDER — BENZONATATE 200 MG PO CAPS: 200.0000 mg | ORAL_CAPSULE | Freq: Three times a day (TID) | ORAL | Status: DC | PRN

## 2013-01-29 NOTE — Patient Instructions (Signed)
   You have cold.  Take acetaminophen (Tylenol) 500mg  or 2X325mg  every 8 hours  & Take Ibuprofen (Advil or Motrin) 2-3 tablets every 8 hours  Use a teaspoon of Honey before bed in a nighttime tea  Prescription for a cough medicine.  Get to a dentist when possible   If you need anything prior to your next visit please call the clinic. Please Bring all medications or accurate medication list with you to each appointment; an accurate medication list is essential in providing you the best care possible.

## 2013-01-29 NOTE — Assessment & Plan Note (Signed)
[facility] care per AVS

## 2013-01-29 NOTE — Progress Notes (Signed)
  IMO CUMBIE - 57 y.o. female MRN [id number]  Date of birth: 1955-02-28  CC, HPI, INTERVAL HISTORY & ROS  Teresa [name] is here today for acute visit.      She reports overall feeling run down. Pt reports acute problems with: # Fatigue & body aches & headaches  Started about 5 days ago.  On and off; seems to be worsening now   + nasuea and vomiting.  + coughing at night  working  Tylenol - minimal effect  theraflu didn't do much  + body aches, chills  No frequency, dysuria; no flank pain  + chills but no significant diaphoresis  Pt denies chest pain, dyspnea at rest or exertion, PND.  Mild and unchanged lower extremity edema associated with amlodipine  Patient denies any facial asymmetry, unilateral weakness, or dysarthria.  History  Hx of CAD with  MI and CVA Past Medical, Surgical, Social, and Family History Reviewed per EMR Medications and Allergies reviewed and all updated if necessary. Objective Findings  VITALS: HR: 89 bpm  BP: 131/88 mmHg  TEMP: 98.1 F (36.7 C) (Oral)  RESP:    HT: 5\' 7"  (170.2 cm)  WT: 195 lb 3.2 oz (88.542 kg)  BMI: 30.6   BP Readings from Last 3 Encounters:  01/29/13 131/88  04/19/12 150/71  04/03/12 138/73   Wt Readings from Last 3 Encounters:  01/29/13 195 lb 3.2 oz (88.542 kg)  04/19/12 188 lb (85.276 kg)  03/31/12 193 lb (87.544 kg)     PHYSICAL EXAM: GENERAL:  adult [place] American female. In no disomfort; no respiratory distress.  Fatigued appearing   PSYCH: alert and appropriate, good insight   HNEENT: H&N: AT/[place], trachea midline, scattered anterior cervical lymphadenopathy   Eyes: no scleral icterus, no conjunctival exudate  Ears:  slightly erythematous external ear canal.  Tympanic membranes pearly gray.  The cone of light.  No effusion.    Nose:  bilateral nasal congestion and minimal exudate.    Oropharynx: MMM, 2+4 tonsils without exudate, minimal posterior oropharyngeal erythema   Dentention:  multiple caries       CARDIO: RRR, S1/S2 heard, no murmur  LUNGS: CTA B, no wheezes, no crackles in all lung fields   ABDOMEN:   EXTREM:  Warm, well perfused.  Moves all 4 extremities spontaneously; no lateralization.  Distal pulses 1+/4.  Trace pretibial edema.  GU:   SKIN:     Assessment & Plan   Problems addressed today: General Plan & Pt Instructions:  1. Viral syndrome       You have cold.  Take acetaminophen (Tylenol) 500mg  or 2X325mg  every 8 hours  & Take Ibuprofen (Advil or Motrin) 2-3 tablets every 8 hours  Use a teaspoon of Honey before bed in a nighttime tea  Prescription for a cough medicine.  Get to a dentist when possible     For further discussion of A/P and for follow up issues see problem based charting if applicable.

## 2013-09-19 ENCOUNTER — Ambulatory Visit
Admission: RE | Admit: 2013-09-19 | Discharge: 2013-09-19 | Disposition: A | Discharge status: Home / Self Care | Payer: BC Managed Care – PPO | Source: Ambulatory Visit | Attending: Family Medicine | Admitting: Family Medicine

## 2013-09-19 ENCOUNTER — Encounter: Payer: Self-pay | Admitting: Family Medicine

## 2013-09-19 ENCOUNTER — Ambulatory Visit (INDEPENDENT_AMBULATORY_CARE_PROVIDER_SITE_OTHER): Payer: BC Managed Care – PPO | Admitting: Family Medicine

## 2013-09-19 VITALS — BP 132/80 | HR 80 | Temp 98.7°F | Ht 67.0 in | Wt 198.3 lb

## 2013-09-19 DIAGNOSIS — Z96659 Presence of unspecified artificial knee joint: Secondary | ICD-10-CM | POA: Diagnosis not present

## 2013-09-19 DIAGNOSIS — Z23 Encounter for immunization: Secondary | ICD-10-CM | POA: Diagnosis not present

## 2013-09-19 DIAGNOSIS — Z96651 Presence of right artificial knee joint: Secondary | ICD-10-CM

## 2013-09-19 DIAGNOSIS — M25569 Pain in unspecified knee: Secondary | ICD-10-CM

## 2013-09-19 DIAGNOSIS — E785 Hyperlipidemia, unspecified: Secondary | ICD-10-CM

## 2013-09-19 DIAGNOSIS — M25561 Pain in right knee: Secondary | ICD-10-CM | POA: Insufficient documentation

## 2013-09-19 DIAGNOSIS — I10 Essential (primary) hypertension: Secondary | ICD-10-CM

## 2013-09-19 LAB — COMPREHENSIVE METABOLIC PANEL
ALBUMIN: 4.3 g/dL (ref 3.5–5.2)
ALT: 14 U/L (ref 0–35)
AST: 18 U/L (ref 0–37)
Alkaline Phosphatase: 75 U/L (ref 39–117)
BUN: 15 mg/dL (ref 6–23)
CALCIUM: 9.3 mg/dL (ref 8.4–10.5)
CHLORIDE: 102 meq/L (ref 96–112)
CO2: 26 mEq/L (ref 19–32)
Creat: 0.77 mg/dL (ref 0.50–1.10)
GLUCOSE: 104 mg/dL — AB (ref 70–99)
POTASSIUM: 3.6 meq/L (ref 3.5–5.3)
Sodium: 139 mEq/L (ref 135–145)
Total Bilirubin: 0.7 mg/dL (ref 0.2–1.2)
Total Protein: 7.7 g/dL (ref 6.0–8.3)

## 2013-09-19 LAB — LDL CHOLESTEROL, DIRECT: Direct LDL: 96 mg/dL

## 2013-09-19 MED ORDER — MELOXICAM 7.5 MG PO TABS: ORAL_TABLET | ORAL | Status: DC

## 2013-09-19 NOTE — Patient Instructions (Signed)
Start the meloxicam for pain--you may take one twice a day or one once a day as you need for knee pain I am checking some lab work today and will send you a note about that I will also send you a note about your x ray results. My office will send the information to Physical Therapy and they will call you directly to make an appointment. If you have not heard from them by early next week please give my office a cal  I would like to see you  Back in 4-6 weeks.
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
## 2013-10-01 ENCOUNTER — Encounter: Payer: Self-pay | Admitting: Family Medicine

## 2013-10-02 ENCOUNTER — Ambulatory Visit: Payer: BC Managed Care – PPO | Attending: Family Medicine

## 2013-10-02 DIAGNOSIS — M25669 Stiffness of unspecified knee, not elsewhere classified: Secondary | ICD-10-CM | POA: Diagnosis not present

## 2013-10-02 DIAGNOSIS — M25569 Pain in unspecified knee: Secondary | ICD-10-CM | POA: Diagnosis not present

## 2013-10-02 DIAGNOSIS — R262 Difficulty in walking, not elsewhere classified: Secondary | ICD-10-CM | POA: Insufficient documentation

## 2013-10-02 DIAGNOSIS — IMO0001 Reserved for inherently not codable concepts without codable children: Secondary | ICD-10-CM | POA: Insufficient documentation

## 2013-10-12 ENCOUNTER — Ambulatory Visit: Payer: BC Managed Care – PPO | Admitting: Physical Therapy

## 2013-10-17 ENCOUNTER — Ambulatory Visit: Payer: BC Managed Care – PPO | Admitting: Physical Therapy

## 2013-12-23 IMAGING — CR DG CHEST 2V
2 series · 2 of 2 positions shown · non-contrast
Comparison: 12/30/2009

CLINICAL DATA: Preoperative evaluation for knee replacement.
History of hypertension, stroke and myocardial infarction.  Ex-
smoker

CHEST - 2 VIEW

[view not recorded (1 of 2)]
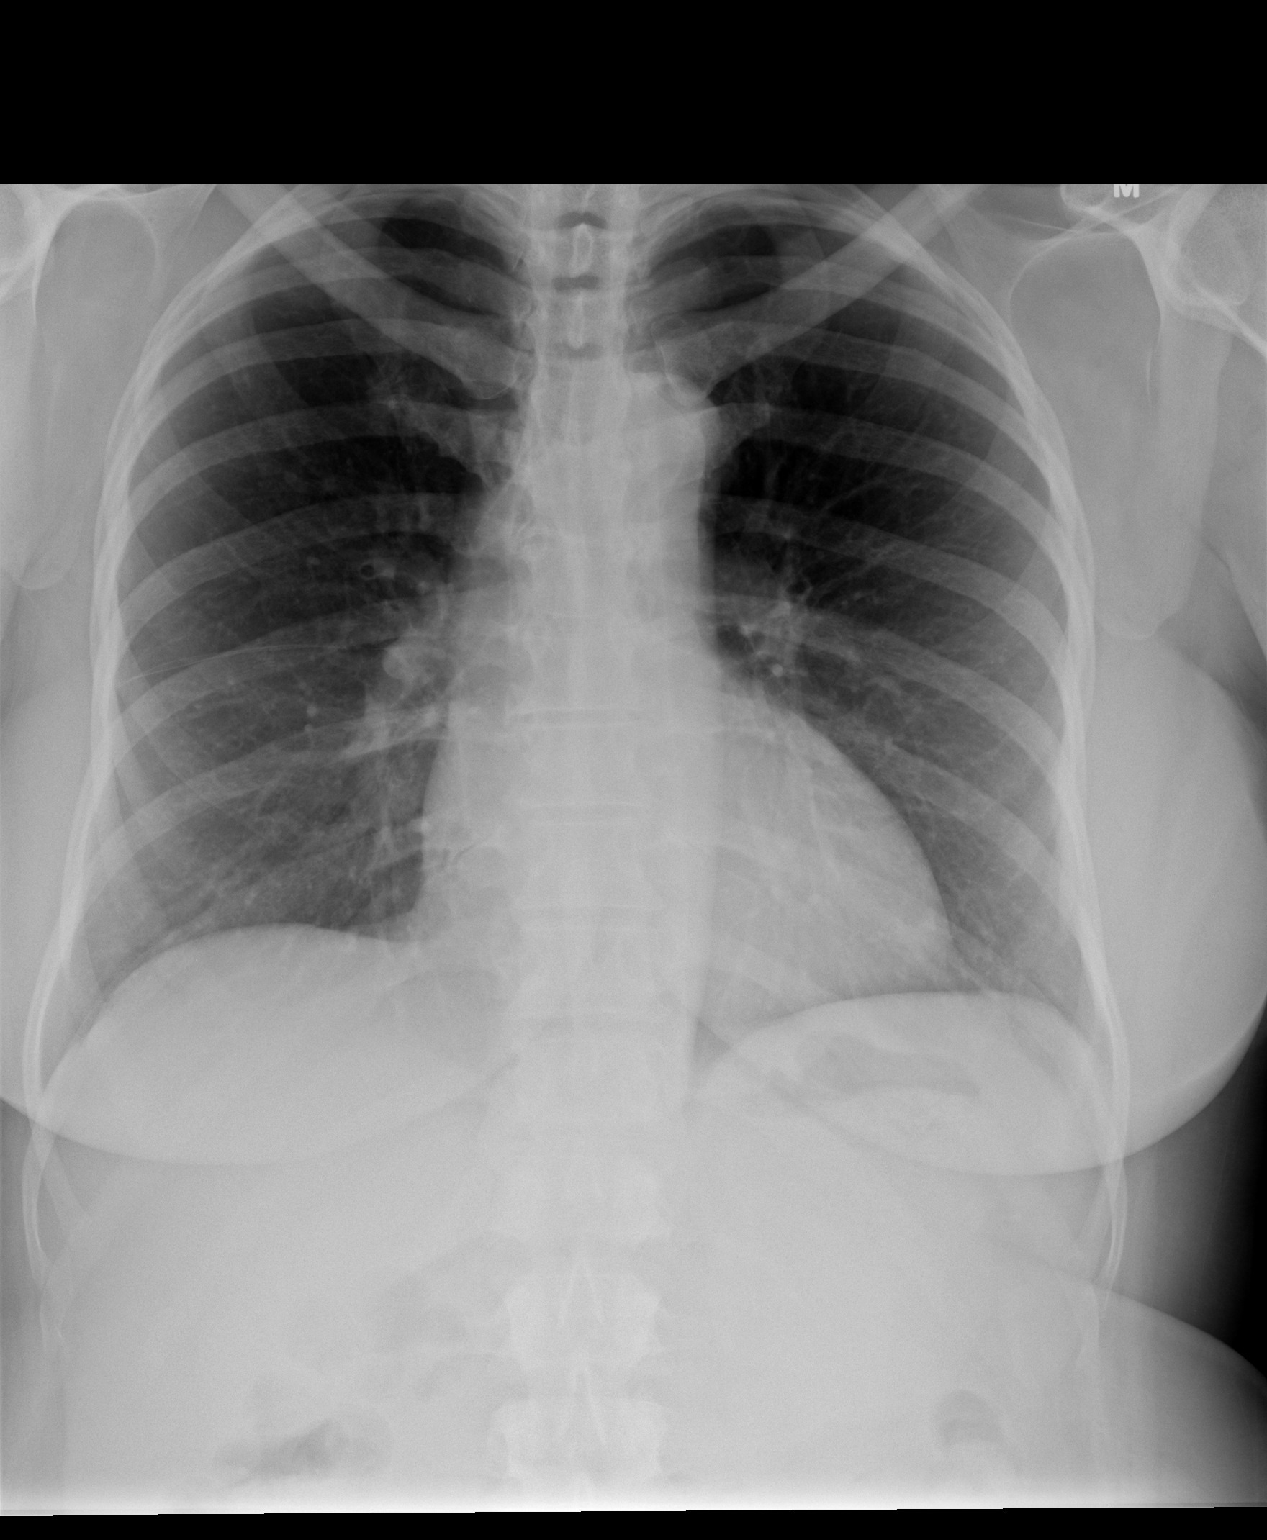

[view not recorded (2 of 2)]
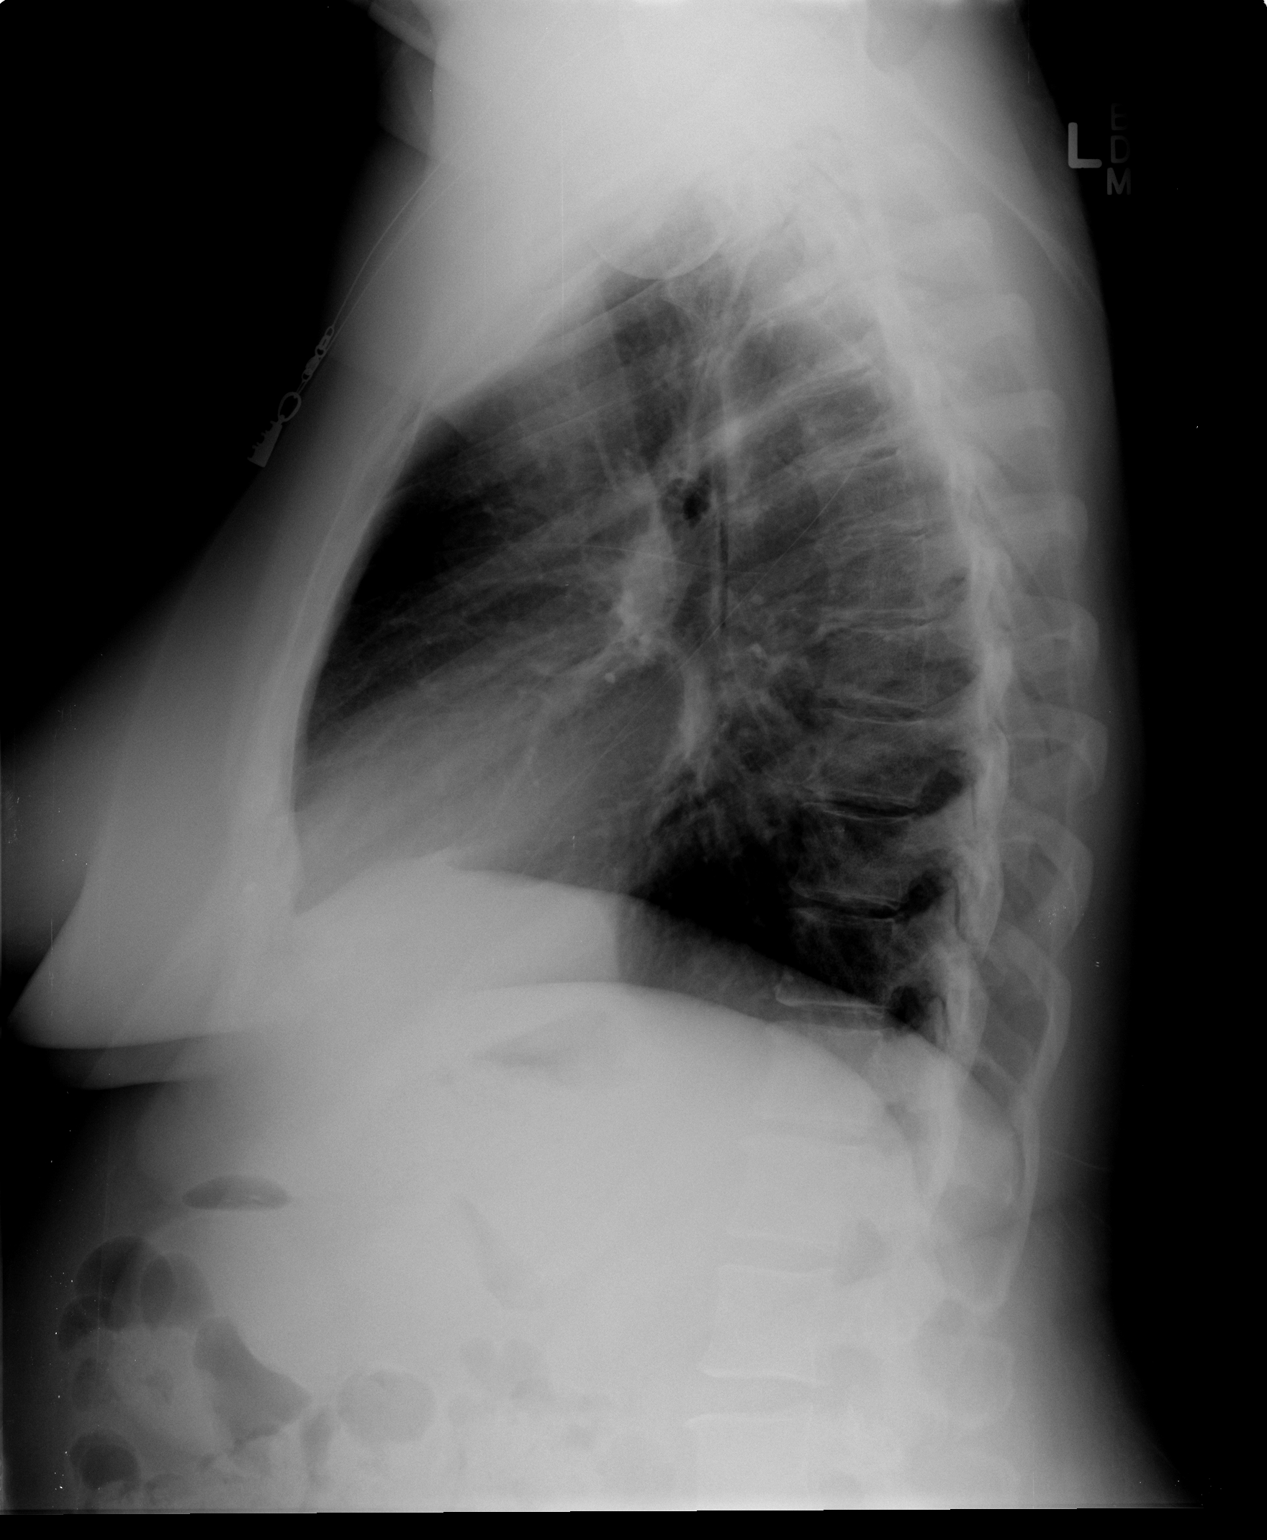

[2 of 2 positions shown; findings below may reference images not displayed]

FINDINGS: Heart and mediastinal contours are within normal limits
and stable.  The lung fields demonstrate stable mild increase in
central peribronchial cuffing compatible with underlying bronchitic
change and history of smoking.  No new focal infiltrates or signs
of congestive failure are seen. No pleural fluid is noted

Bony structures appear intact with screw plate fixation of the
cervical spine again identified.
IMPRESSION: Stable cardiopulmonary appearance with no new focal or acute
abnormality seen

## 2014-02-25 ENCOUNTER — Encounter: Payer: Self-pay | Admitting: Family Medicine

## 2014-02-25 ENCOUNTER — Ambulatory Visit (INDEPENDENT_AMBULATORY_CARE_PROVIDER_SITE_OTHER): Payer: BC Managed Care – PPO | Admitting: Family Medicine

## 2014-02-25 VITALS — BP 168/83 | HR 96 | Temp 98.0°F | Ht 67.0 in | Wt 202.0 lb

## 2014-02-25 DIAGNOSIS — I10 Essential (primary) hypertension: Secondary | ICD-10-CM | POA: Diagnosis not present

## 2014-02-25 DIAGNOSIS — B029 Zoster without complications: Secondary | ICD-10-CM

## 2014-02-25 MED ORDER — GABAPENTIN 300 MG PO CAPS: 300.0000 mg | ORAL_CAPSULE | Freq: Every day | ORAL | Status: DC

## 2014-02-25 MED ORDER — OLMESARTAN-AMLODIPINE-HCTZ 40-5-25 MG PO TABS: 1.0000 | ORAL_TABLET | Freq: Every day | ORAL | Status: DC

## 2014-02-25 NOTE — Assessment & Plan Note (Signed)
Patient with healing outbreak in T 2-3 dermatomal pattern.  Patient is outside the 72 hour window for treatment with Acyclovir/Valtrex. -Gabapentin  QHS for neuralgia. -Tylenol for pain -Return in 2-3 weeks if persistent neuropathic pain.  Consider increasing Neurontin at this time.

## 2014-02-25 NOTE — Assessment & Plan Note (Addendum)
Elevated 168/83.  No CP, SOB, Vision changes. -Continue current therapy.  Refilled rx x1. -Reassess when patient not in pain -Patient to see Dr Sharyn Lull this month. -Consider adjusting HTN meds.

## 2014-02-25 NOTE — Progress Notes (Signed)
Patient ID: SOL ODOR, female   DOB: [birth year], 59 y.o.   MRN: [id number]    Subjective: [id number] on back HPI: Patient is a 59 y.o. female presenting to clinic today for rash on back. Concerns today include:  1. Rash Started on Saturday after Christmas.  Started on back.  Back was tender to touch/scratch.  Burning sensation.  Itches all the time.  No new lotions, soaps, detergents, clothing, foods.  Rash started on breast later in the day.  Used hydrocortisone cream and gold bond with no relief.  Has not used any oral medications.  Denies any weeping or bleeding.  Denies SOB, throat swelling, wheeze, cough, congestion, sick contacts.  Patient states she has a h/o chicken pox and mumps as a child.  2. HTN Sees Dr Sharyn Lull.  Appt this month.  Monitors BP 120-130's/80's. Denies CP, SOB, dizziness, vision changes, swelling in lips or anywhere else.   History Reviewed: former smoker. Health Maintenance: colonoscopy, mammogram due  ROS: All other systems reviewed and are negative.  Objective: Office vital signs reviewed. BP 168/83 mmHg  Pulse 96  Temp(Src) 98 F (36.7 C) (Oral)  Ht  (1.702 m)  Wt 202 lb (91.627 kg)  BMI 31.63 kg/m2  Physical Examination:  General: Awake, alert, well nourished, well appearing female, NAD HEENT: Atraumatic, normocephalic, no angioedema Ext: no cyanosis, clubbing or edema, WWP MSK: Normal gait and station Skin: dry, intact,  small cluster of healing, crusted, non exudative, non bleeding, pustules on R upper thoracic area, right posterior shoulder and right upper breast along the T2-3 dermatome.  Mild erythema, no edema      Assessment: 59 y.o. female with shingles.  Plan: See Problem List and After Visit Summary   Raliegh Ip, DO PGY-1, [facility]

## 2014-02-25 NOTE — Patient Instructions (Signed)
It was a pleasure seeing you today!  Information regarding what we discussed is included in this packet.  Please feel free to call our office if any questions or concerns arise. You rash looks to be a shingles rash.  I have prescribed Gabapentin which you will take at night time.  Also you can use Tylenol for pain.  Make sure to see me backin 2-3 weeks if the pain is not getting better.  We may need to increase your gabapentin dose.    Oliveah Zwack M. Nadine Counts, DO PGY-1, [facility]    Postherpetic Neuralgia Postherpetic neuralgia (PHN) is nerve pain that occurs after a shingles infection. Shingles is a painful rash that appears on one side of the body, usually on your trunk or face. Shingles is caused by the varicella-zoster virus. This is the same virus that causes chickenpox. In people who have had chickenpox, the virus can resurface years later and cause shingles. You may have PHN if you continue to have pain for 3 months after your shingles rash has gone away. PHN appears in the same area where you had the shingles rash. For most people, PHN goes away within 1 year.  Getting a vaccination for shingles can prevent PHN. This vaccine is recommended for people older than 50. It may prevent shingles and may also lower your risk of PHN if you do get shingles. CAUSES PHN is caused by damage to your nerves from the varicella-zoster virus. This damage makes your nerves overly sensitive.  RISK FACTORS Aging is the biggest risk factor for developing PHN. Most people who get PHN are older than 60. Other risk factors include:  Having very bad pain before your shingles rash starts.  Having a very bad rash.  Having shingles in the nerve that supplies your face and eye (trigeminal nerve). SIGNS AND SYMPTOMS Pain is the main symptom of PHN. The pain is often very bad and may be described as stabbing, burning, or feeling like an electric shock. The pain may come and go or may be there all the time.  Pain may be triggered by light touches on the skin or changes in temperature. You may have itching along with the pain. DIAGNOSIS  Your health care provider may diagnose PHN based on your symptoms and your history of shingles. Lab studies and other diagnostic tests are usually not needed. TREATMENT  There is no cure for PHN. Treatment for PHN will focus on pain relief. Over-the-counter pain relievers do not usually relieve PHN pain. You may need to work with a pain specialist. Treatment may include:  Antidepressant medicines to help with pain and improve sleep.  Antiseizure medicines to relieve nerve pain.  Strong pain relievers (opioids).  A numbing patch worn on the skin (lidocaine patch). HOME CARE INSTRUCTIONS It may take a long time to recover from PHN. Work closely with your health care provider, and have a good support system at home.   Take all medicines as directed by your health care provider.  Wear loose, comfortable clothing.  Cover sensitive areas with a dressing to reduce friction from clothing rubbing on the area.  If cold does not make your pain worse, try applying a cool compress or cooling gel pack to the area.  Talk to your health care provider if you feel depressed or desperate. Living with long-term pain can be depressing. SEEK MEDICAL CARE IF:  Your medicine is not helping.  You are struggling to manage your pain at home. Document Released: 05/01/2002  Document Revised: 06/25/2013 Document Reviewed: 01/30/2013 [facility] Patient Information 2015 [place], [place]. This information is not intended to replace advice given to you by your health care provider. Make sure you discuss any questions you have with your health care provider.

## 2014-03-01 ENCOUNTER — Other Ambulatory Visit (HOSPITAL_COMMUNITY): Payer: Self-pay | Admitting: Cardiology

## 2014-03-01 DIAGNOSIS — R079 Chest pain, unspecified: Secondary | ICD-10-CM

## 2014-03-08 ENCOUNTER — Other Ambulatory Visit: Payer: Self-pay

## 2014-03-08 ENCOUNTER — Ambulatory Visit (HOSPITAL_COMMUNITY)
Admission: RE | Admit: 2014-03-08 | Discharge: 2014-03-08 | Disposition: A | Discharge status: Home / Self Care | Payer: BC Managed Care – PPO | Source: Ambulatory Visit | Attending: Cardiology | Admitting: Cardiology

## 2014-03-08 ENCOUNTER — Encounter (HOSPITAL_COMMUNITY)
Admission: RE | Admit: 2014-03-08 | Discharge: 2014-03-08 | Disposition: A | Discharge status: Home / Self Care | Payer: BC Managed Care – PPO | Source: Ambulatory Visit | Attending: Cardiology | Admitting: Cardiology

## 2014-03-08 VITALS — BP 116/66 | HR 82

## 2014-03-08 DIAGNOSIS — R079 Chest pain, unspecified: Secondary | ICD-10-CM

## 2014-03-08 MED ORDER — TECHNETIUM TC 99M SESTAMIBI GENERIC - CARDIOLITE
30.0000 | Freq: Once | INTRAVENOUS | Status: AC | PRN
  Administered 2014-03-08: 30 via INTRAVENOUS

## 2014-03-08 MED ORDER — REGADENOSON 0.4 MG/5ML IV SOLN
0.4000 mg | Freq: Once | INTRAVENOUS | Status: AC
  Administered 2014-03-08: 0.4 mg via INTRAVENOUS

## 2014-03-08 MED ORDER — TECHNETIUM TC 99M SESTAMIBI GENERIC - CARDIOLITE
10.0000 | Freq: Once | INTRAVENOUS | Status: AC | PRN
  Administered 2014-03-08: 10 via INTRAVENOUS

## 2014-03-08 MED ORDER — REGADENOSON 0.4 MG/5ML IV SOLN
INTRAVENOUS | Status: AC
  Filled 2014-03-08: qty 5

## 2014-04-01 NOTE — Progress Notes (Signed)
Reviewed resident assessment and plan.  JB 

## 2014-05-15 ENCOUNTER — Ambulatory Visit (INDEPENDENT_AMBULATORY_CARE_PROVIDER_SITE_OTHER): Payer: BC Managed Care – PPO | Admitting: Family Medicine

## 2014-05-15 ENCOUNTER — Encounter: Payer: Self-pay | Admitting: Family Medicine

## 2014-05-15 VITALS — BP 156/85 | HR 103 | Temp 98.3°F | Wt 201.0 lb

## 2014-05-15 DIAGNOSIS — B029 Zoster without complications: Secondary | ICD-10-CM

## 2014-05-15 MED ORDER — VALACYCLOVIR HCL 1 G PO TABS: 1000.0000 mg | ORAL_TABLET | Freq: Three times a day (TID) | ORAL | Status: DC

## 2014-05-15 MED ORDER — HYDROCODONE-ACETAMINOPHEN 5-325 MG PO TABS: 1.0000 | ORAL_TABLET | Freq: Three times a day (TID) | ORAL | Status: DC | PRN

## 2014-05-15 MED ORDER — GABAPENTIN 300 MG PO CAPS: 300.0000 mg | ORAL_CAPSULE | Freq: Every day | ORAL | Status: DC

## 2014-05-15 NOTE — Patient Instructions (Signed)
Thank you for coming in, today!  I think your pain is from shingles that has not showed up as a rash, yet. Since it started less than 72 hours ago, a medicine to fight the virus should be helpful. Take valacyclovir (Valtrex), 1000 mg, three times per day for the next week.  For pain, you can take hydrocodone-acetaminophen (Norco, Vicodin) up to 3 times per day. Take 1 pill no more than every 8 hours. DO NOT take this pill and drive within about 6 hours. It may make you very sleepy and may cause some mild itching. If it causes itching, you can take Benadryl or another antihistamine over the counter, which may make the sleepiness worse.  Shingles is caused by a virus that lives in your nerves. There is no cure, but the medicines above should help treat it. Shingles sometimes will recur, and may require different types of treatment over time. Come back to see Dr. Jennette KettleNeal, as you need.  Please feel free to call with any questions or concerns at any time, at [phone]. --Dr. Casper HarrisonStreet

## 2014-05-15 NOTE — Progress Notes (Signed)
   Subjective:    Patient ID: Teresa [name], female    DOB: 1956-01-22, 59 y.o.   MRN: [id number]  HPI: [facility] / side / flank pain since about 3:30 PM yesterday. The pain is severe, sore, and tender to touch, even at rest, constant, and does not radiate. She took some ibuprofen, which did not help her pain. Her pain is severe enough that she was unable to sleep well, last night, and has been worse through the day. The area is exquisitely tender to touch, painful even for air to blow across her skin or for her clothes to touch the area. She has no pain on the left side. She has not noticed any rash but she was diagnosed with shingles earlier this year on her left upper back, which has now cleared up (she was outside the 72 hour window of onset of symptoms at that time, so she was not treated with antiviral meds, though gabapentin did help with her pain). She denies fevers / chills, nausea / vomiting, abdominal pain, change in urinary habits, hematuria, or urinary pain. She is compliant with her medications; med list reviewed.  Review of Systems: As above. Denies sick contacts.     Objective:   Physical Exam BP 156/85 mmHg  Pulse 103  Temp(Src) 98.3 F (36.8 C) (Oral)  Wt 201 lb (91.173 kg) Gen: uncomfortable but non-toxic-appearing adult female in NAD HEENT: [place]/AT, EOMI, PERRLA Skin: no frank rashes noted to upper back or area of pain described below Abd / flank:   Hyperesthesia present to right flank in bandline distribution from about 5 cm posterior to midaxillarly line, forward to about 8 cm anterior on the abdomen  No rash noted in area; area of reproducible pain consistent with allodynia of T11 dermatome  No frank peritoneal signs, bowel sounds normal  No CVA tenderness or RUQ, epigastric, periumbilical, or suprapubic tenderness  Pt able to change positions / ambulate without frank abdominal pain MSK: no frank low back pain with movement, muscle tightness  / tenderness, or bony tenderness     Assessment & Plan:  59yo female with suspected recurrent shingles with onset <24 hours ago, felt to represent prodromal pain before rash eruption - DDx also includes UTI / pyelonephritis, nephrolithiasis, frank intraabdominal organic pathology, MSK-related pain, but history / exam seem most consistent with shingles - Rx for valacyclovir 1000 mg TID for 7 days - refilled Rx for gabapentin 300 mg qHS for neuropathic pain - new Rx for short course of Norco for severe pain - advised on natural course of shingles, possibility of recurrence, and possibility of having pain without rash - counseled that rash may or may not erupt - strongly advised close f/u in the next few days if pain is not helped with these measures - also advised return to clinic or presentation to the ED if pain worsens or progresses, or if new symptoms develop such as severe N/V, change in bowel or bladder function, etc  Bobbye Mortonhristopher M Sharene Krikorian, MD PGY-3, [facility] 05/15/2014, 2:25 PM

## 2014-08-16 ENCOUNTER — Ambulatory Visit (HOSPITAL_COMMUNITY)
Admission: RE | Admit: 2014-08-16 | Discharge: 2014-08-16 | Disposition: A | Discharge status: Home / Self Care | Payer: BC Managed Care – PPO | Source: Ambulatory Visit | Attending: Family Medicine | Admitting: Family Medicine

## 2014-08-16 ENCOUNTER — Encounter: Payer: Self-pay | Admitting: Family Medicine

## 2014-08-16 ENCOUNTER — Ambulatory Visit (INDEPENDENT_AMBULATORY_CARE_PROVIDER_SITE_OTHER): Payer: Self-pay | Admitting: Family Medicine

## 2014-08-16 VITALS — BP 148/78 | HR 84 | Temp 98.3°F | Ht 67.0 in | Wt 204.0 lb

## 2014-08-16 DIAGNOSIS — R52 Pain, unspecified: Secondary | ICD-10-CM | POA: Diagnosis present

## 2014-08-16 DIAGNOSIS — M5136 Other intervertebral disc degeneration, lumbar region: Secondary | ICD-10-CM | POA: Insufficient documentation

## 2014-08-16 MED ORDER — GABAPENTIN 300 MG PO CAPS: 300.0000 mg | ORAL_CAPSULE | Freq: Every day | ORAL | Status: DC

## 2014-08-16 NOTE — Patient Instructions (Signed)
Nice to see you. Please go get the x-ray of your back.  You can take the gabapentin for the burning discomfort. If you develop rash, pain, weakness, numbness, loss of bowel or bladder dysfunction, or fevers please seek medical attention.

## 2014-08-17 ENCOUNTER — Telehealth: Payer: Self-pay | Admitting: Family Medicine

## 2014-08-17 ENCOUNTER — Encounter: Payer: Self-pay | Admitting: Family Medicine

## 2014-08-17 DIAGNOSIS — R52 Pain, unspecified: Secondary | ICD-10-CM | POA: Insufficient documentation
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
## 2014-08-17 NOTE — Telephone Encounter (Signed)
Attempted to call patient to advise of x-ray results. There was no answer. Advised to call our office back at her convenience. Will await her call back.

## 2014-08-21 NOTE — Telephone Encounter (Signed)
Attempted to call patient again. No answer. Left voicemail for patient asking her to call the office. Her x-ray did not reveal any fractures. It did show arthritic changes in her back. If she is continuing to have symptoms she should follow-up with her PCP.

## 2014-08-23 NOTE — Telephone Encounter (Signed)
LMTCB will try again later. Terriann Difonzo, CMA. 

## 2014-08-27 NOTE — Telephone Encounter (Signed)
Sent pt msg through Mychart to contact office for x-ray results. Lakeyshia Tuckerman, CMA.

## 2014-10-06 ENCOUNTER — Ambulatory Visit (INDEPENDENT_AMBULATORY_CARE_PROVIDER_SITE_OTHER): Payer: BC Managed Care – PPO | Admitting: Physician Assistant

## 2014-10-06 ENCOUNTER — Ambulatory Visit (INDEPENDENT_AMBULATORY_CARE_PROVIDER_SITE_OTHER): Payer: BC Managed Care – PPO

## 2014-10-06 VITALS — BP 158/80 | HR 86 | Temp 98.5°F | Resp 17 | Ht 68.0 in | Wt 200.0 lb

## 2014-10-06 DIAGNOSIS — M25511 Pain in right shoulder: Secondary | ICD-10-CM

## 2014-10-06 DIAGNOSIS — I10 Essential (primary) hypertension: Secondary | ICD-10-CM

## 2014-10-06 MED ORDER — METHOCARBAMOL 500 MG PO TABS: 250.0000 mg | ORAL_TABLET | Freq: Four times a day (QID) | ORAL | Status: DC

## 2014-10-06 MED ORDER — KETOROLAC TROMETHAMINE 60 MG/2ML IM SOLN
60.0000 mg | Freq: Once | INTRAMUSCULAR | Status: AC
  Administered 2014-10-06: 60 mg via INTRAMUSCULAR

## 2014-10-06 MED ORDER — TRAMADOL HCL 50 MG PO TABS: 50.0000 mg | ORAL_TABLET | Freq: Three times a day (TID) | ORAL | Status: DC | PRN

## 2014-10-06 MED ORDER — OLMESARTAN-AMLODIPINE-HCTZ 40-5-25 MG PO TABS: 1.0000 | ORAL_TABLET | Freq: Every day | ORAL | Status: AC

## 2014-10-06 MED ORDER — CELECOXIB 200 MG PO CAPS: 200.0000 mg | ORAL_CAPSULE | Freq: Every day | ORAL | Status: DC

## 2014-10-06 NOTE — Progress Notes (Signed)
Teresa [name]  MRN: [id number] DOB: [birth year]  Subjective:  Pt presents to clinic for evaluation of right shoulder pain. 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 No recollection of lifting anything heavy or any injury. Saturday pain worsened, described as a throbbing, aching pain. No injury in shoulders before. Since Saturday pt has to use her left hand to hold her right arm into her body. Pt has tried heating pads and motrin with no relief. She also tried using a sling but is not sure that it helped. 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 The pain will go into her arm when she moves her elbow but she does not have elbow pain and she has no neck pain. Also needs refill of her Tibenzor 40-5-25.  Patient Active Problem List   Diagnosis Date Noted  . Burning pain right low back 08/17/2014  . Shingles 02/25/2014  . Knee pain, right 09/19/2013  . Viral syndrome 01/29/2013  . S/P TKR (total knee replacement) 04/19/2012  . HTN (hypertension) 03/17/2012  . CAD (coronary artery disease) 03/17/2012  . HLD (hyperlipidemia) 03/17/2012  . Anemia 03/17/2012  . DJD (degenerative joint disease) of knee 03/17/2012    Current Outpatient Prescriptions on File Prior to Visit  Medication Sig Dispense Refill  . Olmesartan-Amlodipine-HCTZ (TRIBENZOR) 40-5-25 MG TABS Take 1 tablet by mouth daily. 30 tablet 0  . carvedilol (COREG) 12.5 MG tablet take 1 tablet by mouth twice a day (Patient not taking: Reported on 10/06/2014) 60 tablet 0  . gabapentin (NEURONTIN) 300 MG capsule Take 1 capsule (300 mg total) by mouth at bedtime. (Patient not taking: Reported on 10/06/2014) 30 capsule 1  . HYDROcodone-acetaminophen (NORCO) 5-325 MG per tablet Take 1 tablet by mouth every 8 (eight) hours as needed for moderate pain. (Patient not taking: Reported on 10/06/2014) 45 tablet 0  . meloxicam (MOBIC) 7.5 MG tablet Take one or two a day by mouth as needed for  knee pain (Patient not taking: Reported on 10/06/2014) 60 tablet 2   No current facility-administered medications on file prior to visit.    Allergies  Allergen Reactions  . Ace Inhibitors Swelling    Angioedema    Review of Systems  Constitutional: Negative for fever and chills.  Musculoskeletal: Positive for arthralgias.   Objective:  BP 158/80 mmHg  Pulse 86  Temp(Src) 98.5 F (36.9 C) (Oral)  Resp 17  Ht 5\' 8"  (1.727 m)  Wt 200 lb (90.719 kg)  BMI 30.42 kg/m2  SpO2 98%  Physical Exam  Constitutional: She is oriented to person, place, and time and well-developed, well-nourished, and in no distress.  HENT:  Head: Normocephalic and atraumatic.  Right Ear: Hearing and external ear normal.  Left Ear: Hearing and external ear normal.  Eyes: Conjunctivae are normal.  Neck: Normal range of motion.  Pulmonary/Chest: Effort normal.  Musculoskeletal:       Right shoulder: She exhibits tenderness (anterior shoulder/AC joint), spasm (trapezius) and decreased strength. She exhibits normal range of motion.       Left shoulder: Normal.  Pt has limited active ROM and the exam is difficult because she is unable to relax in order to do full shoulder exam -   Neurological: She is alert and oriented to person, place, and time. Gait normal.  Skin: Skin is warm and dry.  Psychiatric: Mood, memory, affect and judgment normal.  Vitals reviewed.  [facility] reading (PRIMARY) by  Dr.  Anderson. Neg  Assessment and Plan :  Pain in joint, shoulder region, right - Plan: DG Shoulder Right   xrays are normal but I am unable to do a good shoulder exam - we will place the patient in a sling for rest - I suspect that the patient has a bursitis - but it is important that she relax her trap and deltoid so she does have have resulting muscle spasms.  She will recheck with me in 3 days and then we will hopefully be able to do a better exam.  Benny Lennert PA-C  [facility] 10/06/2014 10:34 AM

## 2014-10-06 NOTE — Progress Notes (Signed)
   Subjective:    Patient ID: Teresa [name], female    DOB: Jun 18, 1955, 59 y.o.   MRN: [id number]  HPI Pt presents for evaluation of right shoulder pain. 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 No recollection of lifting anything heavy or any injury. Saturday pain worsened, described as a throbbing, aching pain. No injury in shoulders before. Since Saturday pt has to use her left hand to hold her right arm into her body. Pt has tried heating pads and motrin with no relief. She also tried using a sling. 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  Also needs refill of her Tibenzor 40-5-25.  Review of Systems  Constitutional: Negative.   HENT: Negative.   Eyes: Negative.   Respiratory: Negative.   Cardiovascular: Negative.   Gastrointestinal: Positive for abdominal distention.  Musculoskeletal:       R shoulder pain  Neurological: Negative.        Objective:   Physical Exam  Constitutional: She is oriented to person, place, and time. She appears well-developed and well-nourished. No distress.  Neck: Normal range of motion. Neck supple. No tracheal deviation present. No thyromegaly present.  Cardiovascular: Normal rate, regular rhythm and normal heart sounds.  Exam reveals no gallop and no friction rub.   No murmur heard. Pulmonary/Chest: Effort normal and breath sounds normal. No respiratory distress. She has no wheezes. She has no rales. She exhibits no tenderness.  Musculoskeletal:       Arms: Lymphadenopathy:    She has no cervical adenopathy.  Neurological: She is alert and oriented to person, place, and time.  Skin: Skin is warm and dry. No rash noted. She is not diaphoretic. No erythema. No pallor.  Psychiatric: She has a normal mood and affect. Her behavior is normal. Judgment and thought content normal.          Assessment & Plan:    MDM: low suspicious for frozen shoulder due to acute onset of pain 2 days  ago. Shoulder radiograph did not suggest degenerative change. Highest supicion for bursitis at this point. Shoulder will be re-evaluated in 4 days after rest and pain management.

## 2014-10-06 NOTE — Patient Instructions (Signed)
Wear sling - please please please try to relax your arm while wearing the sling to prevent muscle spasms from holding your arm comfortable.  Recheck Wed am - be here close to when our doors open because I am only here until noon that day.

## 2014-10-08 NOTE — Progress Notes (Signed)
  Medical screening examination/treatment/procedure(s) were performed by non-physician practitioner and as supervising physician I was immediately available for consultation/collaboration.     

## 2014-10-08 NOTE — Addendum Note (Signed)
Addended by: Carmelina Dane on: 10/08/2014 05:06 PM   Modules accepted: Kipp Brood

## 2014-10-09 ENCOUNTER — Ambulatory Visit (INDEPENDENT_AMBULATORY_CARE_PROVIDER_SITE_OTHER): Payer: BC Managed Care – PPO | Admitting: Emergency Medicine

## 2014-10-09 ENCOUNTER — Encounter: Payer: Self-pay | Admitting: Emergency Medicine

## 2014-10-09 VITALS — BP 138/78 | HR 58 | Temp 98.6°F | Resp 18 | Ht 68.0 in | Wt 196.0 lb

## 2014-10-09 DIAGNOSIS — M25511 Pain in right shoulder: Secondary | ICD-10-CM

## 2014-10-09 NOTE — Progress Notes (Signed)
Chief Complaint:  Chief Complaint  Patient presents with  . Follow-up    rt arm    HPI: Teresa [name] is a 59 y.o. female who is here for  A follow up regarding her right arm pain.  Patient was seen here last week by Benny Lennert and was told to come in today for a follow up.  She states that her arm pain was a 10/10 during her past visit but it is now a 7/10 after taking her medication.  She states that she had a work for note but will need one again in order to rest her arm until Monday.  She states that she will be back for an office visit to check on the status of her arm. She is willing to continue taking her medication and will begin exercising.  She has no other complaints today.    Past Medical History  Diagnosis Date  . Acute non Q wave MI (myocardial infarction), subsequent episode   . CVA (cerebral infarction)   . Hyperlipidemia   . Hypertension   . Angioedema   . Stroke   . Bronchitis   . Arthritis    Past Surgical History  Procedure Laterality Date  . Neck surgery      rod in neck  . Knee arthroscopy w/ meniscal repair      right  . Knee arthroplasty Right 03/31/2012    Procedure: COMPUTER ASSISTED TOTAL KNEE ARTHROPLASTY;  Surgeon: Harvie Junior, MD;  Location: [facility] OR;  Service: Orthopedics;  Laterality: Right;   Social History   Social History  . Marital Status: Married    Spouse Name: N/A  . Number of Children: N/A  . Years of Education: N/A   Social History Main Topics  . Smoking status: Former Smoker    Quit date: 02/23/2000  . Smokeless tobacco: Never Used  . Alcohol Use: 1.2 oz/week    2 Glasses of wine per week     Comment: occassional  . Drug Use: No  . Sexual Activity: Not Asked   Other Topics Concern  . None   Social History Narrative   History reviewed. No pertinent family history. Allergies  Allergen Reactions  . Ace Inhibitors Swelling    Angioedema   Prior to Admission medications   Medication Sig Start Date End Date  Taking? Authorizing Provider  Olmesartan-Amlodipine-HCTZ (TRIBENZOR) 40-5-25 MG TABS Take 1 tablet by mouth daily. 10/06/14  Yes Morrell Riddle, PA-C  celecoxib (CELEBREX) 200 MG capsule Take 1 capsule (200 mg total) by mouth daily. Patient not taking: Reported on 10/09/2014 10/06/14   Morrell Riddle, PA-C  methocarbamol (ROBAXIN) 500 MG tablet Take 0.5-1 tablets (250-500 mg total) by mouth 4 (four) times daily. Patient not taking: Reported on 10/09/2014 10/06/14   Morrell Riddle, PA-C  traMADol (ULTRAM) 50 MG tablet Take 1 tablet (50 mg total) by mouth every 8 (eight) hours as needed. Patient not taking: Reported on 10/09/2014 10/06/14   Morrell Riddle, PA-C     ROS: The patient denies fevers, chills, night sweats, unintentional weight loss, chest pain, palpitations, wheezing, dyspnea on exertion, nausea, vomiting, abdominal pain, dysuria, hematuria, melena, numbness, weakness, or tingling.   All other systems have been reviewed and were otherwise negative with the exception of those mentioned in the HPI and as above.    PHYSICAL EXAM: Filed Vitals:   10/09/14 0905  BP: 138/78  Pulse: 58  Temp: 98.6 F (37 C)  Resp: 18   Filed Vitals:   10/09/14 0905  Height: 5\' 8"  (1.727 m)  Weight: 196 lb (88.905 kg)   Body mass index is 29.81 kg/(m^2).   General: Alert, no acute distress HEENT:  Normocephalic, atraumatic, oropharynx patent. EOMI, PERRLA Cardiovascular:  Regular rate and rhythm, no rubs murmurs or gallops.  No Carotid bruits, radial pulse intact. No pedal edema.  Respiratory: Clear to auscultation bilaterally.  No wheezes, rales, or rhonchi.  No cyanosis, no use of accessory musculature GI: No organomegaly, abdomen is soft and non-tender, positive bowel sounds.  No masses. Skin: No rashes. Neurologic: Facial musculature symmetric. Psychiatric: Patient is appropriate throughout our interaction. Lymphatic: No cervical lymphadenopathy Musculoskeletal: Gait intact. Patient is comfortable  with her sling on.  She is only able to abduct to 90 degrees.  She has no rotator cuff weakness.    LABS: Results for orders placed or performed in visit on 09/19/13  Comprehensive metabolic panel  Result Value Ref Range   Sodium 139 135 - 145 mEq/L   Potassium 3.6 3.5 - 5.3 mEq/L   Chloride 102 96 - 112 mEq/L   CO2 26 19 - 32 mEq/L   Glucose, Bld 104 (H) 70 - 99 mg/dL   BUN 15 6 - 23 mg/dL   Creat 1.61 0.96 - 0.45 mg/dL   Total Bilirubin 0.7 0.2 - 1.2 mg/dL   Alkaline Phosphatase 75 39 - 117 U/L   AST 18 0 - 37 U/L   ALT 14 0 - 35 U/L   Total Protein 7.7 6.0 - 8.3 g/dL   Albumin 4.3 3.5 - 5.2 g/dL   Calcium 9.3 8.4 - 40.9 mg/dL  LDL Cholesterol, Direct  Result Value Ref Range   Direct LDL 96 mg/dL     EKG/XRAY:   Primary read interpreted by Dr. Cleta Alberts at [facility].   ASSESSMENT/PLAN: Patient seen with a supraspinatus tendinitis of the shoulder. She continues to have difficulty with abduction. She will continue to be out of work until Monday. I did review with her x-rays to do to help with improving her range of motion. She will also apply ice to the area. She will decrease the use of her sling.   Gross sideeffects, risk and benefits, and alternatives of medications d/w patient. Patient is aware that all medications have potential sideeffects and we are unable to predict every sideeffect or drug-drug interaction that may occur.I personally performed the services described in this documentation, which was scribed in my presence. The recorded information has been reviewed and is accurate.  Earl Lites, MD  @[facility] @ 10/09/2014 9:15 AM

## 2014-10-10 ENCOUNTER — Telehealth: Payer: Self-pay

## 2014-10-10 NOTE — Telephone Encounter (Signed)
PA completed for celecoxib on covermymeds. Pt tried/failed ibuprofen, and has h/o peptic ulcer disease. PA approved through 10/10/15. Notified pharm.

## 2015-04-10 ENCOUNTER — Encounter: Payer: Self-pay | Admitting: Family Medicine

## 2015-04-10 ENCOUNTER — Ambulatory Visit (HOSPITAL_COMMUNITY)
Admission: RE | Admit: 2015-04-10 | Discharge: 2015-04-10 | Disposition: A | Discharge status: Home / Self Care | Payer: BC Managed Care – PPO | Source: Ambulatory Visit | Attending: Family Medicine | Admitting: Family Medicine

## 2015-04-10 ENCOUNTER — Ambulatory Visit (INDEPENDENT_AMBULATORY_CARE_PROVIDER_SITE_OTHER): Payer: BC Managed Care – PPO | Admitting: Family Medicine

## 2015-04-10 ENCOUNTER — Telehealth: Payer: Self-pay | Admitting: Family Medicine

## 2015-04-10 ENCOUNTER — Telehealth: Payer: Self-pay | Admitting: *Deleted

## 2015-04-10 VITALS — BP 145/78 | HR 79 | Temp 97.5°F | Ht 66.75 in | Wt 203.8 lb

## 2015-04-10 DIAGNOSIS — M47892 Other spondylosis, cervical region: Secondary | ICD-10-CM | POA: Insufficient documentation

## 2015-04-10 DIAGNOSIS — M542 Cervicalgia: Secondary | ICD-10-CM

## 2015-04-10 DIAGNOSIS — M6248 Contracture of muscle, other site: Secondary | ICD-10-CM

## 2015-04-10 DIAGNOSIS — M62838 Other muscle spasm: Secondary | ICD-10-CM

## 2015-04-10 MED ORDER — ASPIRIN EC 81 MG PO TBEC: 81.0000 mg | DELAYED_RELEASE_TABLET | Freq: Every day | ORAL | Status: AC

## 2015-04-10 MED ORDER — METHOCARBAMOL 500 MG PO TABS: 250.0000 mg | ORAL_TABLET | Freq: Four times a day (QID) | ORAL | Status: DC

## 2015-04-10 NOTE — Progress Notes (Signed)
   Subjective: CC: neck pain HPI:Teresa [name] is a 60 y.o. female presenting to clinic today for same day appointment. PCP: [name], MD Concerns today include:  1. Neck pain Patient reports that she had neck surgery with a rod placed several years ago.  She notes that a screw was loose at that time.  She has not had it evaluated in a while.  Patient reports she started developing pain in her neck about 1.5 weeks ago.  She notes that pain was initially intermittent but is now constant with movement, esp looking down and side to side.  No new exercise regimen, beds, injury.  Patient took Naproxen OTC last night, which helped some but did not relieve pain.  Not using heat but used a donut to try and sleep.  No numbness/tingling or weakness in hands.  No radiation of pain.  Social History Reviewed: former smoker. FamHx and MedHx reviewed.  Please see EMR.  ROS: Per HPI  Objective: Office vital signs reviewed. BP 145/78 mmHg  Pulse 79  Temp(Src) 97.5 F (36.4 C) (Oral)  Ht 5' 6.75" (1.695 m)  Wt 203 lb 12.8 oz (92.443 kg)  BMI 32.18 kg/m2  Physical Examination:  General: Awake, alert, well nourished, No acute distress Spine: Decreased ROM of the cervical spine in all planes.  Increased tonicity of the cervical paraspinal musculature. +Midline TTP of C-Spine, +Paraspinal TTP of C-Spine, +Flattening of C-Spine but no obvious bony abnormalities.  No TTP or decreased ROM in the rest of the spine. Cardio: regular rate Pulm: normal work of breathing on room air. MSK: Normal gait and station Neuro: UE Strength and UE light touch sensation grossly intact, negative Spurling's  Assessment/ Plan: 60 y.o. female   1. Muscle spasms of neck.  Suspect that pain is muscular in nature.  However, in the setting of cervical spine surgery, cannot rule out bony involvement.  No neurologic complaints or findings on exam. - methocarbamol (ROBAXIN) 500 MG tablet; Take 0.5-1 tablets (250-500 mg total) by  mouth 4 (four) times daily.  Dispense: 20 tablet; Refill: 0 - Sports Med Advisor Muscle Spasm of Neck handout provided - Return precautions reviewed  2. Cervical pain (neck). See above. 06/10/2009 CT neck study reviewed. - DG Cervical Spine Complete; Future - Will need to refer to neurosurgery if gross abnormality visualized on imaging. - Follow up with PCP as needed.   Raliegh Ip, DO PGY-2, [facility]

## 2015-04-10 NOTE — Patient Instructions (Signed)
An xray has been ordered for your neck.  I recommend getting this today.  I have excused you from work for the next 2 days.  A prescription for a muscle relaxer has been sent to your pharmacy.  Please make sure to NOT operate heavy machinery while taking this medication.  Additional recommendations are attached.

## 2015-04-10 NOTE — Telephone Encounter (Signed)
Pt informed of Dr. Nadine Counts previous message.Teresa [name], Aemilia Dedrick D, CMA

## 2015-04-10 NOTE — Telephone Encounter (Signed)
Called to discuss cervical spine xray, which was unremarkable for any acute abnormalities.  Patient can continue muscle relaxer.  No referral needed to neuro surgery.  Front office staff/ CMA can disclose this information to patient if she calls back.  Shalayne Leach M. Nadine Counts, DO PGY-2, [facility]

## 2015-05-19 ENCOUNTER — Ambulatory Visit (INDEPENDENT_AMBULATORY_CARE_PROVIDER_SITE_OTHER): Payer: BC Managed Care – PPO | Admitting: Family Medicine

## 2015-05-19 ENCOUNTER — Encounter: Payer: Self-pay | Admitting: Family Medicine

## 2015-05-19 VITALS — BP 142/83 | HR 79 | Temp 98.0°F | Ht 67.0 in | Wt 201.5 lb

## 2015-05-19 DIAGNOSIS — M79651 Pain in right thigh: Secondary | ICD-10-CM

## 2015-05-19 DIAGNOSIS — M79652 Pain in left thigh: Secondary | ICD-10-CM

## 2015-05-19 LAB — COMPLETE METABOLIC PANEL WITH GFR
ALT: 13 U/L (ref 6–29)
AST: 18 U/L (ref 10–35)
Albumin: 4.3 g/dL (ref 3.6–5.1)
Alkaline Phosphatase: 73 U/L (ref 33–130)
BILIRUBIN TOTAL: 0.6 mg/dL (ref 0.2–1.2)
BUN: 13 mg/dL (ref 7–25)
CHLORIDE: 101 mmol/L (ref 98–110)
CO2: 29 mmol/L (ref 20–31)
Calcium: 9.2 mg/dL (ref 8.6–10.4)
Creat: 0.71 mg/dL (ref 0.50–0.99)
GLUCOSE: 82 mg/dL (ref 65–99)
POTASSIUM: 4.4 mmol/L (ref 3.5–5.3)
SODIUM: 140 mmol/L (ref 135–146)
Total Protein: 7.2 g/dL (ref 6.1–8.1)

## 2015-05-19 LAB — CK: CK TOTAL: 241 U/L — AB (ref 7–177)

## 2015-05-19 NOTE — Patient Instructions (Signed)
Thank you for coming to see me today. It was a pleasure. Today we talked about:   Thigh pain: I will get some labs. Please continue to take the Aleve medication.  Please make an appointment to see Dr. Jennette KettleNeal for follow-up.  If you have any questions or concerns, please do not hesitate to call the office at [phone].  Sincerely,  Jacquelin Hawkingalph Brelyn Woehl, MD

## 2015-05-19 NOTE — Progress Notes (Signed)
    Subjective   Teresa [name] is a 60 y.o. female that presents for a same day visit  1. Thigh pain: Symptoms started one and a half months but worsened three days ago. She states she has sharp burning sensations that are present from 5 seconds to about one minute. Symptoms occur about every two hours on a daily bases. She has not been using anything to help with the pain. Symptoms only occur in her upper thighs and occur bilaterally. No back injury. No associated back pain. No urine/bowel incontinence or urine retention. No leg weakness. She takes Aleve which helps.   ROS Per HPI  Social History  Substance Use Topics  . Smoking status: Former Smoker    Quit date: 02/23/2000  . Smokeless tobacco: Never Used  . Alcohol Use: 1.2 oz/week    2 Glasses of wine per week     Comment: occassional    Allergies  Allergen Reactions  . Ace Inhibitors Swelling    Angioedema    Objective   BP 142/83 mmHg  Pulse 79  Temp(Src) 98 F (36.7 C) (Oral)  Ht 5\' 7"  (1.702 m)  Wt 201 lb 8 oz (91.4 kg)  BMI 31.55 kg/m2  General: Well appearing, no distress Musculoskeletal: No tenderness to thighs on palpation. Patient experiences pain in thighs with flexion of hip past around 45 degrees. Strength limited by pain. Normal sensation bilaterally. Reflexes 2+ bilaterally. FABER and FADIR negative. Internal and external rotation full bilaterally  Assessment and Plan   1. Bilateral thigh pain Possibly rhabdomyolysis vs muscle strain. Recommended patient drink plenty of fluids. Aleve OTC for pain since that has been helping. Will get some lab work. Return precautions discussed. - CK - COMPLETE METABOLIC PANEL WITH GFR

## 2015-05-27 ENCOUNTER — Telehealth: Payer: Self-pay | Admitting: Family Medicine

## 2015-05-27 NOTE — Telephone Encounter (Signed)
Physician Telephone Note  Reason for call: Lab results  Discussion with patient: Patient not available. Voice mail left. Called to check on symptoms. CK slightly elevated but not in a range that I would make me worry about rhabdomyolysis. If patient still having symptoms, would recommend good water intake. If worsening, may need to be seen again.  Jacquelin Hawkingalph Nettey, MD PGY-3, [facility] 05/27/2015, 11:48 AM

## 2015-05-28 ENCOUNTER — Ambulatory Visit: Payer: Self-pay | Admitting: Family Medicine

## 2015-05-28 ENCOUNTER — Telehealth: Payer: Self-pay | Admitting: Family Medicine

## 2015-05-28 NOTE — Telephone Encounter (Signed)
Spoke to pt. She has an appointment with Dr. Jennette KettleNeal next Wed.  Pain is coming more often. Would like to have a Rx for pain. Advil is not working. Please advise. Sunday SpillersSharon T Saunders, CMA

## 2015-05-28 NOTE — Telephone Encounter (Signed)
Pt called and wanted to know what she can take for the pain in her legs. She has been taking aleve at the double dosage for the pain and this is not helping at all. She would like to know if something can be called in stronger. She has an appointment with Dr. Jennette KettleNeal next week. j w

## 2015-05-28 NOTE — Telephone Encounter (Signed)
Dear Cliffton AstersWhite Team Unfortunately I do not know why her legs are hurting so I cannot recommend anything specific. I would recommend she stick with the intended OTC dose of aleve until I see her. THANKS! Denny LevySara Caliegh Middlekauff

## 2015-05-29 NOTE — Telephone Encounter (Signed)
LVM for pt to call back to inform her of below. Zimmerman Rumple, Jalacia Mattila D, CMA  

## 2015-05-30 NOTE — Telephone Encounter (Signed)
Dear Cliffton AstersWhite Team As per my original note, I acannot rx pain meds for something I do not know what I am treating. Denny LevySara Vernadine Coombs

## 2015-06-04 ENCOUNTER — Encounter: Payer: Self-pay | Admitting: Family Medicine

## 2015-06-04 ENCOUNTER — Ambulatory Visit (INDEPENDENT_AMBULATORY_CARE_PROVIDER_SITE_OTHER): Payer: BC Managed Care – PPO | Admitting: Family Medicine

## 2015-06-04 VITALS — BP 136/77 | HR 92 | Temp 98.0°F | Ht 67.0 in | Wt 205.9 lb

## 2015-06-04 DIAGNOSIS — M5417 Radiculopathy, lumbosacral region: Secondary | ICD-10-CM | POA: Diagnosis not present

## 2015-06-04 MED ORDER — LORAZEPAM 1 MG PO TABS: ORAL_TABLET | ORAL | Status: AC

## 2015-06-04 MED ORDER — GABAPENTIN 100 MG PO CAPS: ORAL_CAPSULE | ORAL | Status: DC

## 2015-06-04 NOTE — Progress Notes (Signed)
   Subjective:    Patient ID: Teresa [name], female    DOB: 17-Jul-1955, 60 y.o.   MRN: [id number]  HPI CHIEF COMPLAINT: Bilateral anterior thigh pain, intermittent.  Almost daily or twice daily she will have acute onset of 10 out of 10 anterior thigh pain that is sharp and aching. It will last anywhere from 10 seconds to several minutes and then it self resolves. It does not seem to be related to specific activity. She's taking over-the-counter medications without any improvement. The pain does not radiate past the knee. She has no leg weakness associated with it.   Review of Systems No recent unusual weight change, fever, sweats, chills. She's noted no rash. She's had no appetite change. No bowel or bladder incontinence. No other unusual arthralgias or myalgias.    Objective:   Physical Exam Vital signs reviewed. GENERAL: Well-developed, well-nourished, no acute distress. CARDIOVASCULAR: Regular rate and rhythm no murmur gallop or rub LUNGS: Clear to auscultation bilaterally, no rales or wheeze. ABDOMEN: Soft positive bowel sounds NEURO: No gross focal neurological deficits. Cranial nerves II through XII are grossly intact. Gait is normal and steady. DTRs at the knee 2+ bilaterally symmetrical. Intact sensation to soft touch bilateral feet. VASCULAR: Dorsalis pedis and posterior tibial pulses are 2+ bilaterally symmetrical MSK: Movement of extremity x 4. Hips bilaterally have full painless range of motion. Axial loading of the hips does not reproduce any type of pain. She has normal flexion at the hips and hyperextension both of which are painless. NECK: Full range of motion flexion extension and lateral rotation. Negative Spurling's bilaterally.  IMAGING: I reviewed her lumbar spine films from June which showed some moderate arthritic change with some multilevel disc space narrowing most prominent at T12-L1 and some ventral endplate spurring [id number] 5. PERTINENT  PMH / PSH: I have reviewed  the patient's medications, allergies, past medical and surgical history. Pertinent findings that relate to today's visit / issues include: TKR right, CAD, htn, hyperlipidemia.      Assessment & Plan:

## 2015-06-04 NOTE — Telephone Encounter (Signed)
Pt seen today by Dr. Jennette KettleNeal. Scheduled MRI. Sunday SpillersSharon T Attallah Ontko, CMA

## 2015-06-04 NOTE — Patient Instructions (Signed)
Start the gabapentin---one at night for 3 days then increase to 2 at night for 3 days then increase to three at night and stay at that dose. Let's get you set up for MRI and I will call you a day after I get the results back. If you have not heard from me within 2 working days after the MRI please call the oiffice and leave me a message.

## 2015-06-04 NOTE — Assessment & Plan Note (Signed)
Unclear what her radiculopathy is related to. I reviewed her back films. She does have some ventral spurring. We'll start her on gabapentin, MRI, follow-up with her by phone after the MRI. We discussed red flags to watch out for such as sudden increase in pain, numbness, weakness in the lower extremities, bowel or bladder incontinence.

## 2015-06-09 NOTE — Telephone Encounter (Signed)
PT had an appointment on 06/04/2015 with Dr. Jennette KettleNeal. Joycelyn ManZimmerman Rumple, April D, CMA

## 2015-06-13 ENCOUNTER — Ambulatory Visit (HOSPITAL_COMMUNITY)
Admission: RE | Admit: 2015-06-13 | Discharge: 2015-06-13 | Disposition: A | Discharge status: Home / Self Care | Payer: BC Managed Care – PPO | Source: Ambulatory Visit | Attending: Family Medicine | Admitting: Family Medicine

## 2015-06-13 DIAGNOSIS — M4806 Spinal stenosis, lumbar region: Secondary | ICD-10-CM | POA: Diagnosis not present

## 2015-06-13 DIAGNOSIS — M47896 Other spondylosis, lumbar region: Secondary | ICD-10-CM | POA: Insufficient documentation

## 2015-06-13 DIAGNOSIS — M5126 Other intervertebral disc displacement, lumbar region: Secondary | ICD-10-CM | POA: Diagnosis not present

## 2015-06-13 DIAGNOSIS — M5417 Radiculopathy, lumbosacral region: Secondary | ICD-10-CM | POA: Insufficient documentation

## 2015-06-16 ENCOUNTER — Telehealth: Payer: Self-pay | Admitting: Family Medicine

## 2015-06-16 DIAGNOSIS — M5417 Radiculopathy, lumbosacral region: Secondary | ICD-10-CM

## 2015-06-16 NOTE — Telephone Encounter (Signed)
Tia I am going to put in an order for epidural steroid injections for her. Does that go to you or my nurse? THANKS! Denny LevySara Brynne Doane

## 2015-06-16 NOTE — Telephone Encounter (Signed)
This would go to your nurses. Unless you are wanting her to receive these injections from a MD outside of our office.

## 2015-06-16 NOTE — Telephone Encounter (Signed)
I was informed that a referral to pain management or interventional radiology at GI will need to be done. Please advise. Sunday SpillersSharon T Keiyon Plack, CMA

## 2015-06-16 NOTE — Telephone Encounter (Signed)
[phone] (M) [phone]) Notified her of results via phone Discussed options  She would like to try epidural steroid inj. The gabapentin is not helping. She says episodes are lasting longer.  I am not 100% sure the disc bulging seen on MRI is what is causing her pain so I think epidural steroid inj would be a good way to help determine as well as potentially treat. If no effect, then I would next do a vascuaalr study--it would be unusual to have thigh cramping (claudication) without association of exercise and prioximal rather than distal, but I would get vascular eval next if this does not pan out.

## 2015-06-18 ENCOUNTER — Other Ambulatory Visit: Payer: Self-pay | Admitting: Family Medicine

## 2015-06-18 DIAGNOSIS — M79652 Pain in left thigh: Principal | ICD-10-CM

## 2015-06-18 DIAGNOSIS — M79651 Pain in right thigh: Secondary | ICD-10-CM

## 2015-06-19 ENCOUNTER — Ambulatory Visit
Admission: RE | Admit: 2015-06-19 | Discharge: 2015-06-19 | Disposition: A | Discharge status: Home / Self Care | Payer: BC Managed Care – PPO | Source: Ambulatory Visit | Attending: Family Medicine | Admitting: Family Medicine

## 2015-06-19 ENCOUNTER — Telehealth: Payer: Self-pay | Admitting: Radiology

## 2015-06-19 DIAGNOSIS — M79652 Pain in left thigh: Principal | ICD-10-CM

## 2015-06-19 DIAGNOSIS — M79651 Pain in right thigh: Secondary | ICD-10-CM

## 2015-06-19 MED ORDER — IOPAMIDOL (ISOVUE-M 200) INJECTION 41%
1.0000 mL | Freq: Once | INTRAMUSCULAR | Status: AC
  Administered 2015-06-19: 1 mL via EPIDURAL

## 2015-06-19 MED ORDER — METHYLPREDNISOLONE ACETATE 40 MG/ML INJ SUSP (RADIOLOG
120.0000 mg | Freq: Once | INTRAMUSCULAR | Status: AC
  Administered 2015-06-19: 120 mg via EPIDURAL

## 2015-06-19 NOTE — Discharge Instructions (Signed)

## 2015-06-19 NOTE — Telephone Encounter (Signed)
Pt had an epidural steroid injections at 9:00 am today and is calling because she has a temp of 101.5. Explained that anything we did today would not show up for a couple of days. She also said she was achy and chilled. Suggested she call her physician.  Dr. Bonnielee HaffHoss made aware.

## 2015-07-11 ENCOUNTER — Telehealth: Payer: Self-pay | Admitting: Family Medicine

## 2015-07-11 DIAGNOSIS — M62838 Other muscle spasm: Secondary | ICD-10-CM

## 2015-07-11 NOTE — Telephone Encounter (Signed)
Teresa [name] states she has been having leg cramps for few days now. Requesting Dr. Jennette KettleNeal to prescribe something that can help. Please advise.

## 2015-07-15 MED ORDER — METHOCARBAMOL 500 MG PO TABS: 250.0000 mg | ORAL_TABLET | Freq: Three times a day (TID) | ORAL | Status: DC

## 2015-07-15 NOTE — Telephone Encounter (Signed)
Patient calling again about getting a prescription sent in for leg cramps

## 2015-07-15 NOTE — Telephone Encounter (Signed)
Dear Cliffton AstersWhite Team Please let her know I have called in some muscle relaxers. I only gave her a few as it should not be an every day medicine. THANKS! Denny LevySara Jonnae Fonseca

## 2015-07-16 NOTE — Telephone Encounter (Signed)
Pt informed. Zimmerman Rumple, April D, CMA  

## 2015-07-31 ENCOUNTER — Encounter: Payer: Self-pay | Admitting: Family Medicine

## 2015-07-31 ENCOUNTER — Ambulatory Visit (INDEPENDENT_AMBULATORY_CARE_PROVIDER_SITE_OTHER): Payer: BC Managed Care – PPO | Admitting: Family Medicine

## 2015-07-31 VITALS — BP 164/85 | HR 81 | Temp 98.3°F | Wt 204.6 lb

## 2015-07-31 DIAGNOSIS — M62838 Other muscle spasm: Secondary | ICD-10-CM

## 2015-07-31 DIAGNOSIS — M6248 Contracture of muscle, other site: Secondary | ICD-10-CM | POA: Diagnosis not present

## 2015-07-31 DIAGNOSIS — M5417 Radiculopathy, lumbosacral region: Secondary | ICD-10-CM | POA: Diagnosis not present

## 2015-07-31 MED ORDER — GABAPENTIN 300 MG PO CAPS: 300.0000 mg | ORAL_CAPSULE | Freq: Three times a day (TID) | ORAL | Status: DC

## 2015-07-31 MED ORDER — METHOCARBAMOL 500 MG PO TABS: 250.0000 mg | ORAL_TABLET | Freq: Three times a day (TID) | ORAL | Status: AC

## 2015-07-31 NOTE — Progress Notes (Signed)
   Subjective:    Patient ID: Teresa [name], female    DOB: 28-Jul-1955, 60 y.o.   MRN: [id number]  HPI Patient for follow up of back and right thigh pain.  Seen by Dr. Jennette KettleNeal and dxed with radiculopathy.  MRI of lumbar spine shows degenerative disc disease, facet arthritis and disc buldging.  Her most striking MRI finding is at the L3/L4 level on the right and that correlates well with her symptoms of anterior thigh pain.  Denies weakness.  Leg has not given out on her.  No bowel or bladder problems.  She took gabapentin 100 without sedation or benefit. She then took robaxin without much benefit.   She has not contraindication for NSAIDs and has not been taking regularly.     Review of Systems     Objective:   Physical Exam  No current anesthesia.  Right thigh pain matches the L3 nerve root dermatone.        Assessment & Plan:

## 2015-07-31 NOTE — Patient Instructions (Signed)
Gabapentin, if we can find a dose that works for you, should be the best every day medication for your back and leg problem. You were taking 100 mg.  I have increased to 300 mg.  We have room to increase even more. On top of the every day gabapentin, you can add some ibuprofen (pain medicine) and the robaxin 500 (muscle relaxer).  It is probably best if you save one or both of these medicines for your bad days.   See me or Dr. Jennette KettleNeal in 2-3 weeks to see how this plan is working.  We can make further changes then.

## 2015-07-31 NOTE — Assessment & Plan Note (Signed)
I believe most of her symptoms are from the radiculopathy.  As such, a nerve pain med, ie gabapentin is the first choice.  Robaxin and ibuprofen can be used as adjuvants.  Given lack of benefit or sedation from low dose gabapentin, will push the dose.

## 2015-08-08 ENCOUNTER — Telehealth: Payer: Self-pay | Admitting: Family Medicine

## 2015-08-08 DIAGNOSIS — M5417 Radiculopathy, lumbosacral region: Secondary | ICD-10-CM

## 2015-08-08 MED ORDER — GABAPENTIN 300 MG PO CAPS: ORAL_CAPSULE | ORAL | Status: AC

## 2015-08-08 NOTE — Telephone Encounter (Signed)
Pt states the gabapentin is not helping her back pain.   She is currently 300 mg 3 times a day. Dr Leveda Annahensel told her at the last visit if it didn't help, he could up the dosage. Please advise

## 2015-08-08 NOTE — Telephone Encounter (Signed)
LM for patient ok per DPR with instructions regarding her medication.  Asked her to call back regarding her injections.  Patient last had this done on April 27th and no new order has been placed since.  Will check with MD to see if this needed.   Lariah Fleer,CMA

## 2015-08-08 NOTE — Telephone Encounter (Signed)
Patient calls again, would like to speak to someone before the weekend. Please advise.

## 2015-08-08 NOTE — Telephone Encounter (Signed)
Dear Teresa AstersWhite Team Please ask her to increase the gabapentin to one AM, one at lunch and (now) TWO at bedtiome. Did she get set up for epid[facility] steroid injectins for [facility] back pain [facility] If not, let meknow Denny LevySara Alanta Scobey

## 2015-08-11 NOTE — Telephone Encounter (Signed)
[facility] is setting up her injections (epidural) Teresa LevySara Lolita [name]  [name] I just got this message as well--it MAY bethat she has already had all of her injections. Usually theyy wikll only do a certain number per year. You may haveto ask [name] if that is the case--if so--then please lether know THANKS! Teresa LevySara Yeni Jiggetts

## 2015-08-14 ENCOUNTER — Other Ambulatory Visit: Payer: Self-pay | Admitting: *Deleted

## 2015-08-14 DIAGNOSIS — M5417 Radiculopathy, lumbosacral region: Secondary | ICD-10-CM

## 2015-08-20 ENCOUNTER — Encounter: Payer: Self-pay | Admitting: Family Medicine

## 2015-08-20 ENCOUNTER — Ambulatory Visit (INDEPENDENT_AMBULATORY_CARE_PROVIDER_SITE_OTHER): Payer: BC Managed Care – PPO | Admitting: Family Medicine

## 2015-08-20 VITALS — BP 138/72 | HR 96 | Temp 98.5°F | Ht 67.0 in | Wt 210.6 lb

## 2015-08-20 DIAGNOSIS — M5417 Radiculopathy, lumbosacral region: Secondary | ICD-10-CM

## 2015-08-20 MED ORDER — PREGABALIN 75 MG PO CAPS: 75.0000 mg | ORAL_CAPSULE | Freq: Two times a day (BID) | ORAL | Status: AC

## 2015-08-20 NOTE — Patient Instructions (Addendum)
Thank you for coming to see me today. It was a pleasure. Today we talked about:   Back pain with right thigh pain: I will start you on Lyrica and discontinue your gabapentin. Please take: gabapentin 1 capsule (300mg ) three times daily for 5 days;   then take: gabapentin 1 capsule (300mg ) two times daily for 5 days;   then take: gabapentin 1 capsule (300mg ) one time daily for 5 days, then stop.  If you develop urinary or fecal incontinence, leg weakness or falls, or pain continues to worsen, please return for reevaluation.  Please make an appointment to see Dr. Jennette KettleNeal in about 1-3 months.  If you have any questions or concerns, please do not hesitate to call the office at [phone].  Sincerely,  Jacquelin Hawkingalph Nettey, MD  Pregabalin capsules What is this medicine? PREGABALIN (pre GAB a lin) is used to treat nerve pain from diabetes, shingles, spinal cord injury, and fibromyalgia. It is also used to control seizures in epilepsy. This medicine may be used for other purposes; ask your health care provider or pharmacist if you have questions. What should I tell my health care provider before I take this medicine? They need to know if you have any of these conditions: -bleeding problems -heart disease, including heart failure -history of alcohol or drug abuse -kidney disease -suicidal thoughts, plans, or attempt; a previous suicide attempt by you or a family member -an unusual or allergic reaction to pregabalin, gabapentin, other medicines, foods, dyes, or preservatives -pregnant or trying to get pregnant or trying to conceive with your partner -breast-feeding How should I use this medicine? Take this medicine by mouth with a glass of water. Follow the directions on the prescription label. You can take this medicine with or without food. Take your doses at regular intervals. Do not take your medicine more often than directed. Do not stop taking except on your doctor's advice. A special  MedGuide will be given to you by the pharmacist with each prescription and refill. Be sure to read this information carefully each time. Talk to your pediatrician regarding the use of this medicine in children. Special care may be needed. Overdosage: If you think you have taken too much of this medicine contact a poison control center or emergency room at once. NOTE: This medicine is only for you. Do not share this medicine with others. What if I miss a dose? If you miss a dose, take it as soon as you can. If it is almost time for your next dose, take only that dose. Do not take double or extra doses. What may interact with this medicine? -alcohol -certain medicines for blood pressure like captopril, enalapril, or lisinopril -certain medicines for diabetes, like pioglitazone or rosiglitazone -certain medicines for anxiety or sleep -narcotic medicines for pain This list may not describe all possible interactions. Give your health care provider a list of all the medicines, herbs, non-prescription drugs, or dietary supplements you use. Also tell them if you smoke, drink alcohol, or use illegal drugs. Some items may interact with your medicine. What should I watch for while using this medicine? Tell your doctor or healthcare professional if your symptoms do not start to get better or if they get worse. Visit your doctor or health care professional for regular checks on your progress. Do not stop taking except on your doctor's advice. You may develop a severe reaction. Your doctor will tell you how much medicine to take. Wear a medical identification bracelet or chain if  you are taking this medicine for seizures, and carry a card that describes your disease and details of your medicine and dosage times. You may get drowsy or dizzy. Do not drive, use machinery, or do anything that needs mental alertness until you know how this medicine affects you. Do not stand or sit up quickly, especially if you are an  older patient. This reduces the risk of dizzy or fainting spells. Alcohol may interfere with the effect of this medicine. Avoid alcoholic drinks. If you have a heart condition, like congestive heart failure, and notice that you are retaining water and have swelling in your hands or feet, contact your health care provider immediately. The use of this medicine may increase the chance of suicidal thoughts or actions. Pay special attention to how you are responding while on this medicine. Any worsening of mood, or thoughts of suicide or dying should be reported to your health care professional right away. This medicine has caused reduced sperm counts in some men. This may interfere with the ability to father a child. You should talk to your doctor or health care professional if you are concerned about your fertility. Women who become pregnant while using this medicine for seizures may enroll in the [place] Antiepileptic Drug Pregnancy Registry by calling [phone]. This registry collects information about the safety of antiepileptic drug use during pregnancy. What side effects may I notice from receiving this medicine? Side effects that you should report to your doctor or health care professional as soon as possible: -allergic reactions like skin rash, itching or hives, swelling of the face, lips, or tongue -breathing problems -changes in vision -chest pain -confusion -jerking or unusual movements of any part of your body -loss of memory -muscle pain, tenderness, or weakness -suicidal thoughts or other mood changes -swelling of the ankles, feet, hands -unusual bruising or bleeding Side effects that usually do not require medical attention (Report these to your doctor or health care professional if they continue or are bothersome.): -dizziness -drowsiness -dry mouth -headache -nausea -tremors -trouble sleeping -weight gain This list may not describe all possible side effects. Call  your doctor for medical advice about side effects. You may report side effects to FDA at 1-800-FDA-1088. Where should I keep my medicine? Keep out of the reach of children. This medicine can be abused. Keep your medicine in a safe place to protect it from theft. Do not share this medicine with anyone. Selling or giving away this medicine is dangerous and against the law. This medicine may cause accidental overdose and death if it taken by other adults, children, or pets. Mix any unused medicine with a substance like cat litter or coffee grounds. Then throw the medicine away in a sealed container like a sealed bag or a coffee can with a lid. Do not use the medicine after the expiration date. Store at room temperature between 15 and 30 degrees C (59 and 86 degrees F). NOTE: This sheet is a summary. It may not cover all possible information. If you have questions about this medicine, talk to your doctor, pharmacist, or health care provider.    2016, Elsevier/Gold Standard. (2013-04-06 15:38:53)

## 2015-08-20 NOTE — Progress Notes (Signed)
    Subjective    Delbert Phenixva M Finstad is a 60 y.o. female that presents for a follow-up visit for:   1. Right leg pain: This issue has been going on for several months. She has been taking gabapentin 600mg  three times per day. She has episodes of sharp pain right thigh pain that occur intermittently without any aggravating causes. She reports no weakness or falls. No bladder retention/incontinence. No bowel incontinence.  Social History  Substance Use Topics  . Smoking status: Former Smoker    Quit date: 02/23/2000  . Smokeless tobacco: Never Used  . Alcohol Use: 1.2 oz/week    2 Glasses of wine per week     Comment: occassional    Allergies  Allergen Reactions  . Ace Inhibitors Swelling    Angioedema    ROS  Per HPI   Objective   BP 158/78 mmHg  Pulse 96  Temp(Src) 98.5 F (36.9 C) (Oral)  Ht 5\' 7"  (1.702 m)  Wt 210 lb 9.6 oz (95.528 kg)  BMI 32.98 kg/m2  Vital signs reviewed  General: Well appearing, no distress  Assessment and Plan    Radiculopathy of lumbosacral region Patient continues to increase dose at home without added benefit. She would like a different medication. Will taper down gabapentin and start Lyrica 75mg  twice daily. Follow-up with PCP.

## 2015-08-21 ENCOUNTER — Other Ambulatory Visit: Payer: Self-pay | Admitting: Family Medicine

## 2015-08-21 DIAGNOSIS — M47817 Spondylosis without myelopathy or radiculopathy, lumbosacral region: Secondary | ICD-10-CM

## 2015-08-21 NOTE — Assessment & Plan Note (Signed)
Patient continues to increase dose at home without added benefit. She would like a different medication. Will taper down gabapentin and start Lyrica 75mg  twice daily. Follow-up with PCP.

## 2015-09-15 ENCOUNTER — Ambulatory Visit
Admission: RE | Admit: 2015-09-15 | Discharge: 2015-09-15 | Disposition: A | Discharge status: Home / Self Care | Payer: BC Managed Care – PPO | Source: Ambulatory Visit | Attending: Family Medicine | Admitting: Family Medicine

## 2015-09-15 ENCOUNTER — Inpatient Hospital Stay: Admission: RE | Admit: 2015-09-15 | Payer: BC Managed Care – PPO | Source: Ambulatory Visit

## 2015-09-15 ENCOUNTER — Other Ambulatory Visit: Payer: Self-pay | Admitting: Family Medicine

## 2015-09-15 DIAGNOSIS — M47817 Spondylosis without myelopathy or radiculopathy, lumbosacral region: Secondary | ICD-10-CM

## 2015-09-15 DIAGNOSIS — M4307 Spondylolysis, lumbosacral region: Secondary | ICD-10-CM

## 2015-09-15 MED ORDER — IOPAMIDOL (ISOVUE-M 200) INJECTION 41%
1.0000 mL | Freq: Once | INTRAMUSCULAR | Status: AC
  Administered 2015-09-15: 1 mL via EPIDURAL

## 2015-09-15 MED ORDER — METHYLPREDNISOLONE ACETATE 40 MG/ML INJ SUSP (RADIOLOG
120.0000 mg | Freq: Once | INTRAMUSCULAR | Status: AC
  Administered 2015-09-15: 120 mg via EPIDURAL

## 2015-09-15 NOTE — Discharge Instructions (Signed)

## 2018-08-21 ENCOUNTER — Ambulatory Visit: Payer: BC Managed Care – PPO | Admitting: Family Medicine

## 2018-08-21 ENCOUNTER — Other Ambulatory Visit: Payer: Self-pay

## 2018-08-21 VITALS — BP 110/62 | HR 88 | Wt 202.0 lb

## 2018-08-21 DIAGNOSIS — M25462 Effusion, left knee: Secondary | ICD-10-CM

## 2018-08-21 DIAGNOSIS — M25562 Pain in left knee: Secondary | ICD-10-CM | POA: Diagnosis not present

## 2018-08-21 MED ORDER — ZIKS ARTHRITIS PAIN RELIEF 0.025-1-12 % EX CREA: 1.0000 "application " | TOPICAL_CREAM | Freq: Four times a day (QID) | CUTANEOUS | 0 refills | Status: AC

## 2018-08-21 NOTE — Patient Instructions (Addendum)
Thank you for coming in to see [place] today. Please see below to review our plan for today's visit.  Keep you left leg elevated when sitting or lying. Also use cold compress to the area to reduce swelling. Edema in this area often take a few weeks to resolve. For pain control, apply the Capsaicin to the affected area up to 4 times daily and wash hands after use. You can supplement with Aleve (500 mg twice daily) as needed. Please return in 1 week if symptoms have not improved. If your knee gets worse, you develop a fever, or your begin having shortness of breath, chest pain, or cough, seek medical attention.  Please call the clinic at [phone] if your symptoms worsen or you have any concerns. It was our pleasure to serve you.  Harriet Butte, [facility], PGY-3

## 2018-08-21 NOTE — Assessment & Plan Note (Addendum)
Acute.  Seems to be affecting anterior thigh muscle and spares left knee joint and aspect of left leg.  No affiliated dependent edema.  Low concern for septic arthritis with reassuring vitals.  Less consistent with gout.  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  Suspect swelling and tenderness is related to OA versus contusion of left thigh.  Well score for DVT 0. - Given prescription for capsaicin with instructions to apply 4 times daily and supplement with Aleve as needed for breakthrough pain - Reviewed return precautions, RTC 1 week if not improved at which point we should consider basic lab work including CBC, kidney function, uric acid, and plain films

## 2018-08-21 NOTE — Progress Notes (Signed)
   Subjective   Patient ID: Teresa [name]    DOB: 1955/10/24, 63 y.o. female   MRN: [id number]  CC: "Knot on knee"  HPI: Teresa [name] is a 63 y.o. female who presents to clinic today for the following:  Location: left knee over anterolateral surface Pain started: 1 week ago and became worse 3 days ago Pain is: aching is intermitent Severity: 8/10 Medications tried: Aleve and a muscle relaxer without improvement Recent trauma: no Similar pain previously: no  Symptoms Redness: no Swelling: no Fever: no Weakness: no Weight loss: no Rash: no  ROS: see HPI for pertinent.  [place]: Reviewed. Smoking status reviewed. Medications reviewed.  Objective   BP 110/62   Pulse 88   Wt 202 lb (91.6 kg)   BMI 31.64 kg/m  Vitals and nursing note reviewed.  General: well nourished, well developed, NAD with non-toxic appearance HEENT: normocephalic, atraumatic, moist mucous membranes Lungs: normal work of breathing Skin: warm, dry, no rashes or lesions Extremities: warm and well perfused, normal tone, moderate tenderness to the anterior lateral surface of the distal thigh with mild edema and firmness without palpable fluctuance, left joint space of knee nontender without affiliated edema, 5/5 motor strength, patient able to bear weight and ambulate although there is some associated pain when bearing on left leg only, negative Lockman, ROM intact, neurovascular intact  Assessment & Plan   Pain and swelling of left knee Acute.  Seems to be affecting anterior thigh muscle and spares left knee joint and aspect of left leg.  No affiliated dependent edema.  Low concern for septic arthritis with reassuring vitals.  Less consistent with gout.  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  Suspect swelling and tenderness is related to OA versus contusion of left thigh.  Well score for DVT 0. - Given  prescription for capsaicin with instructions to apply 4 times daily and supplement with Aleve as needed for breakthrough pain - Reviewed return precautions, RTC 1 week if not improved at which point we should consider basic lab work including CBC, kidney function, uric acid, and plain films  No orders of the defined types were placed in this encounter.  Meds ordered this encounter  Medications  . Capsaicin-Menthol-Methyl Sal (CAPSAICIN-METHYL SAL-MENTHOL) 0.025-1-12 % CREA    Sig: Apply 1 application topically 4 (four) times daily.    Dispense:  56.6 g    Refill:  0    Harriet Butte, DO [place], PGY-3 08/21/2018, 10:43 AM

## 2018-09-15 ENCOUNTER — Ambulatory Visit: Payer: BC Managed Care – PPO | Admitting: Family Medicine

## 2018-09-15 ENCOUNTER — Other Ambulatory Visit: Payer: Self-pay

## 2018-09-15 VITALS — BP 124/62 | HR 69 | Ht 67.0 in | Wt 200.5 lb

## 2018-09-15 DIAGNOSIS — M25462 Effusion, left knee: Secondary | ICD-10-CM

## 2018-09-15 DIAGNOSIS — M25562 Pain in left knee: Secondary | ICD-10-CM | POA: Diagnosis not present

## 2018-09-15 MED ORDER — METHYLPREDNISOLONE ACETATE 80 MG/ML IJ SUSP
80.0000 mg | Freq: Once | INTRAMUSCULAR | Status: AC
  Administered 2018-09-15: 11:00:00 80 mg via INTRA_ARTICULAR

## 2018-09-15 NOTE — Assessment & Plan Note (Signed)
Most likely osteoarthritic in nature, no inciting event -Depo-Medrol injection to left knee -Tylenol and ibuprofen as needed for pain relief -We will send patient for x-ray of left knee

## 2018-09-15 NOTE — Progress Notes (Signed)
   Subjective:    Patient ID: Teresa [name], female    DOB: 1955-12-05, 63 y.o.   MRN: [id number]   CC: Left knee pain  HPI: Patient presenting with 3 weeks of left knee pain, reports no inciting incident.  Patient states pain is a throbbing, aching pain that is primarily located in the posterior aspect of the knee.  She takes Tylenol and Aleve as needed for pain control, as well as uses an ice pack topically to the knee as well.  She denies fevers, myalgias, chest pain, shortness of breath, abdominal pain, weakness to the lower extremity, and sensation loss to the lower extremity.  Review of Systems: See HPI   Objective:  BP 124/62   Pulse 69   Ht 5\' 7"  (1.702 m)   Wt 200 lb 8 oz (90.9 kg)   SpO2 98%   BMI 31.40 kg/m  Vitals and nursing note reviewed  General: Pleasant female, no apparent distress Cardiac: RRR, clear S1 and S2, no murmurs appreciated Respiratory: CTA bilaterally, normal work of breathing Abdomen: Soft, nontender, bowel sounds auscultated throughout Extremities: ~8" vertical midline Scar noted to right knee S/P previous knee replacement surgery, healed; no edema or cyanosis noted to bilateral lower extremities, pulses 2+ TP and DP pulses to feet bilaterally, brisk capillary refill to bilateral lower extremities appreciated; range of motion to bilateral knees symmetrical, however pain is elicited with range of motion in right knee; no particular edema appreciated to either knee; negative Lachman, negative anterior drawer, negative posterior drawer, negative valgus/varus strain, and negative patellar grind testing to left knee. Skin: warm and dry, no rashes noted Neuro: alert and oriented, no focal deficits; strength 5/5 to bilateral lower extremities, sensation intact  After consent was obtained, using sterile technique the LEFT knee was cleaned with Iodine. A total of 4cc mixture of 1cc 80mg  DepoMedrol with 3cc 1% Xylocaine were then injected into the joint from the  medial infrapatellar approach. The procedure was well tolerated.  The patient may resume normal activities as tolerated.  It may be more painful for the first 1-2 days.  Watch for fever, worsening swelling or persistent pain in knee. Call or return to clinic prn if such symptoms occur or the knee fails to improve as anticipated.   Assessment & Plan:   Pain and swelling of left knee Most likely osteoarthritic in nature, no inciting event -Depo-Medrol injection to left knee -Tylenol and ibuprofen as needed for pain relief -We will send patient for x-ray of left knee  Return if symptoms worsen or fail to improve.   Dr. Milus Banister [facility], PGY-2

## 2018-09-15 NOTE — Patient Instructions (Addendum)
Thank you for coming in to see [place] today! Please see below to review our plan for today's visit:  1. Go get your knee X-ray! 2. Keep walking around using your knee, you might have some more pain tonight as the Lidocaine wears off, but the steroid will kick in and make you feel better. 3. Continue to take Tylenol (500mg ) and Ibuprofen (200mg ) together up to 3-4 times daily as needed for pain. 4. Feel free to place ice on your knee as needed 3-4 times daily for 15 minutes each time.   Please call the clinic at [phone] if your symptoms worsen or you have any concerns. It was our pleasure to serve you!    Dr. Milus Banister [facility]

## 2019-02-20 ENCOUNTER — Ambulatory Visit (INDEPENDENT_AMBULATORY_CARE_PROVIDER_SITE_OTHER): Payer: BC Managed Care – PPO | Admitting: Family Medicine

## 2019-02-20 ENCOUNTER — Other Ambulatory Visit: Payer: Self-pay

## 2019-02-20 ENCOUNTER — Encounter: Payer: Self-pay | Admitting: Family Medicine

## 2019-02-20 VITALS — BP 120/80 | HR 80 | Wt 204.2 lb

## 2019-02-20 DIAGNOSIS — M25562 Pain in left knee: Secondary | ICD-10-CM | POA: Diagnosis not present

## 2019-02-20 DIAGNOSIS — M25462 Effusion, left knee: Secondary | ICD-10-CM

## 2019-02-20 NOTE — Patient Instructions (Addendum)
Dear Marvetta Gibbons,   It was good to see you! Thank you for taking your time to come in to be seen. Today, we discussed the following:   Knee Pain (Left)   Please go to [facility] at [address]  We will call you with the results.  Please use Tylenol as the mainstay of pain control.  You can take ibuprofen a few times a week, but I would avoid taking it multiple times a day for several days in a row.  The burning sensation in your leg could be due to many things.  Does not sound exactly like your previous shingles episode, but it could be some nerve irritation in your lower back.  This should go away.  However, if it does not and gets worse, please come and see [place].  Please call your orthopedist to schedule an appointment for further evaluation if continued worsening of your knee  I am also referring you to physical therapy to help with your walk and muscle strength  Be well,   Zettie Cooley, M.D   [place] [phone]  *Sign up for MyChart for instant access to your health profile, labs, orders, upcoming appointments or to contact your provider with questions*  ===================================================================================

## 2019-02-21 NOTE — Assessment & Plan Note (Signed)
Patient has had continued left knee pain and has been seen for this previously.  She reported that the knee pain resolved and so she did not get the x-rays.  Will order x-rays today to evaluate for osteoarthritis.  Given her positive McMurray's and her history of her knee giving out, this sounds more like a meniscal issue.  I have encouraged her to reach out to her orthopedic surgeon if she desires further work-up as appointments can be scarce due to Covid.  Will not order MRI at this time and will defer this to orthopedic surgeon if he feels is necessary.  Reviewed medications with patient and encouraged her to stop any NSAIDs given her kidney disease.  She should continue to use Tylenol for mainstay of pain treatment.  We will also start physical therapy to help strengthen muscles.  Patient is agreeable to plan.  She reports that she will likely not get to the x-ray until next week but does want to get it.  Patient was seen with medical student, Maudry Diego.  HPI was collected by medical student and personally reviewed with patient.  I personally physically examined the patient with assessment and plan as above.

## 2019-02-21 NOTE — Progress Notes (Signed)
Subjective  CC: Knee Pain (Left)  HPI:Teresa [name] is a 63 y.o. female who presents today with the following problems:  Left Knee Pain Left knee pain: Since June patient has been dealing with transient anteriorlateral left knee pain. Her pain has been refractory to topical NSAIDs, capsaicin, and a depo-medrol injection. Within this past week patient's anterior lateral left knee pain returned. She notes pain while walking and changes to her gait. Her knee "gives out" while she walks without any noticeable inciting event. The pain occurs while she sleeps and forces her to sleep on her right side. She notes her pain returns upon awakening. In addition, she notes a new burning sensation that occurs on that lateral portion of her left shin. Patient has a past medical history of shingles that presented with burning pain on her upper right back region. She notes her the burning sensation of her lateral left shin differs as it is not sensitive to touch. She denies fever, chills, erythema of lower extremity, and recent lesions  Pertinent PM/FHx: Total R knee secondary to osteoarthritis and trauma at work, shingles, lumbosacral radiculopathy Social Hx: Teresa [name]  reports that she quit smoking about 19 years ago. She has never used smokeless tobacco. She reports current alcohol use of about 2.0 standard drinks of alcohol per week. She reports that she does not use drugs.  ROS: Pertinent ROS included in HPI. Objective  Physical Exam:  BP 120/80   Pulse 80   Wt 204 lb 3.2 oz (92.6 kg)   SpO2 100%   BMI 31.98 kg/m  General: Well-appearing female, no acute distress Lower extremities: There is no swelling or erythema on initial inspection of bilateral knees.  Patient has negative anterior and posterior drawer.  Negative valgus and vagus stress.  McMurray's test is positive with both internal and external rotation maneuvers.  Neither knee is ballotable.  There are no obvious abnormalities appreciated except for  surgical scar on right knee.  There is no lower extremity edema  Assessment & Plan    Problem List Items Addressed This Visit      Active Problems   Pain and swelling of left knee    Patient has had continued left knee pain and has been seen for this previously.  She reported that the knee pain resolved and so she did not get the x-rays.  Will order x-rays today to evaluate for osteoarthritis.  Given her positive McMurray's and her history of her knee giving out, this sounds more like a meniscal issue.  I have encouraged her to reach out to her orthopedic surgeon if she desires further work-up as appointments can be scarce due to Covid.  Will not order MRI at this time and will defer this to orthopedic surgeon if he feels is necessary.  Reviewed medications with patient and encouraged her to stop any NSAIDs given her kidney disease.  She should continue to use Tylenol for mainstay of pain treatment.  We will also start physical therapy to help strengthen muscles.  Patient is agreeable to plan.  She reports that she will likely not get to the x-ray until next week but does want to get it.  Patient was seen with medical student, Maudry Diego.  HPI was collected by medical student and personally reviewed with patient.  I personally physically examined the patient with assessment and plan as above.       Other Visit Diagnoses    Left knee pain, unspecified chronicity    -  Primary   Relevant Orders   DG Knee Complete 4 Views Left   Ambulatory referral to Physical Therapy     Melene Plan, M.D.  4:48 PM 02/21/2019

## 2019-02-26 ENCOUNTER — Ambulatory Visit
Admission: RE | Admit: 2019-02-26 | Discharge: 2019-02-26 | Disposition: A | Discharge status: Home / Self Care | Payer: BC Managed Care – PPO | Source: Ambulatory Visit | Attending: Family Medicine | Admitting: Family Medicine

## 2019-02-26 DIAGNOSIS — M25562 Pain in left knee: Secondary | ICD-10-CM

## 2019-10-15 ENCOUNTER — Other Ambulatory Visit: Payer: Self-pay | Admitting: Family Medicine

## 2019-10-15 DIAGNOSIS — Z1231 Encounter for screening mammogram for malignant neoplasm of breast: Secondary | ICD-10-CM

## 2019-10-25 ENCOUNTER — Ambulatory Visit
Admission: RE | Admit: 2019-10-25 | Discharge: 2019-10-25 | Disposition: A | Discharge status: Home / Self Care | Payer: Medicare PPO | Source: Ambulatory Visit | Attending: Family Medicine | Admitting: Family Medicine

## 2019-10-25 ENCOUNTER — Other Ambulatory Visit: Payer: Self-pay

## 2019-10-25 DIAGNOSIS — Z1231 Encounter for screening mammogram for malignant neoplasm of breast: Secondary | ICD-10-CM

## 2020-02-20 ENCOUNTER — Ambulatory Visit
Admission: RE | Admit: 2020-02-20 | Discharge: 2020-02-20 | Disposition: A | Discharge status: Home / Self Care | Payer: Medicare PPO | Source: Ambulatory Visit | Attending: Family Medicine | Admitting: Family Medicine

## 2020-02-20 ENCOUNTER — Other Ambulatory Visit: Payer: Self-pay

## 2020-02-20 ENCOUNTER — Other Ambulatory Visit: Payer: Self-pay | Admitting: Family Medicine

## 2020-02-20 DIAGNOSIS — M5416 Radiculopathy, lumbar region: Secondary | ICD-10-CM

## 2020-02-20 DIAGNOSIS — M25552 Pain in left hip: Secondary | ICD-10-CM

## 2020-04-02 ENCOUNTER — Encounter: Payer: Self-pay | Admitting: Physical Therapy

## 2020-04-02 ENCOUNTER — Other Ambulatory Visit: Payer: Self-pay

## 2020-04-02 ENCOUNTER — Ambulatory Visit: Payer: Medicare (Managed Care) | Attending: Family Medicine | Admitting: Physical Therapy

## 2020-04-02 DIAGNOSIS — M62830 Muscle spasm of back: Secondary | ICD-10-CM | POA: Diagnosis present

## 2020-04-02 DIAGNOSIS — M6281 Muscle weakness (generalized): Secondary | ICD-10-CM | POA: Diagnosis present

## 2020-04-02 DIAGNOSIS — R2689 Other abnormalities of gait and mobility: Secondary | ICD-10-CM | POA: Diagnosis present

## 2020-04-02 DIAGNOSIS — G8929 Other chronic pain: Secondary | ICD-10-CM | POA: Diagnosis present

## 2020-04-02 DIAGNOSIS — M5442 Lumbago with sciatica, left side: Secondary | ICD-10-CM | POA: Insufficient documentation

## 2020-04-02 NOTE — Therapy (Addendum)
[place], [place], [address] Phone: [phone]   Fax:  [phone]  Physical Therapy Evaluation / Discharge  Patient Details  Name: Teresa [name] MRN: [id number] Date of Birth: 08-Mar-1955 Referring Provider (PT): Buzzy Han, MD   Encounter Date: 04/02/2020   PT End of Session - 04/02/20 0950    Visit Number 1    Number of Visits 13    Date for PT Re-Evaluation 05/14/20    Authorization Type [employer]: Kx mod at 15th visit, FOTO at 6th and 10th visit    PT Start Time 0931    PT Stop Time 1015    PT Time Calculation (min) 44 min    Activity Tolerance Patient tolerated treatment well    Behavior During Therapy [facility] for tasks assessed/performed           Past Medical History:  Diagnosis Date  . Acute non Q wave MI (myocardial infarction), subsequent episode   . Angioedema   . Arthritis   . Bronchitis   . CVA (cerebral infarction)   . Hyperlipidemia   . Hypertension   . Stroke [facility])     Past Surgical History:  Procedure Laterality Date  . KNEE ARTHROPLASTY Right 03/31/2012   Procedure: COMPUTER ASSISTED TOTAL KNEE ARTHROPLASTY;  Surgeon: Alta Corning, MD;  Location: [place];  Service: Orthopedics;  Laterality: Right;  . KNEE ARTHROSCOPY W/ MENISCAL REPAIR     right  . NECK SURGERY     rod in neck    There were no vitals filed for this visit.    Subjective Assessment - 04/02/20 0939    Subjective pt is 65 y.o F with CC or low back pain returning about a year ago. She reports seeing a chiroprator back kn 2016 and reports it went completelye away, but reported the pain started back 1 year ago with no speicifc cause. pain starts in the back on the L and radiates down to the foot. since onset she reports the pain fluctuates in severity, depend on activity and medication. pt denies any red flags.    How long can you sit comfortably? 1 hour    How long can you stand  comfortably? 1 hours    How long can you walk comfortably? unlimited    Diagnostic tests x-ray 02/20/2020 IMPRESSION:  Stable lumbar degenerative changes.  No acute abnormality    Patient Stated Goals to stop the pain, return to walking without pain.    Currently in Pain? Yes    Pain Score 0-No pain   at worst 8/10   Pain Descriptors / Indicators Tightness;Aching    Pain Type Chronic pain    Pain Radiating Towards radite to the LLE into the foot    Pain Onset More than a month ago    Pain Frequency Intermittent    Aggravating Factors  prolonged sitting/ standing,    Pain Relieving Factors walking, medication, ice    Effect of Pain on Daily Activities limited positional tolerance              [facility] PT Assessment - 04/02/20 0944      Assessment   Medical Diagnosis Pain in left hip (M25.552), Radiculopathy, lumbar region (M54.16)    Referring Provider (PT) Buzzy Han, MD    Onset Date/Surgical Date --   1 year ago   Next MD Visit --   3 months   Prior Therapy no      Precautions  Precautions None      Restrictions   Weight Bearing Restrictions No      Balance Screen   Has the patient fallen in the past 6 months No      [place] residence    Living Arrangements Spouse/significant other;Children    Available Help at Discharge Family    Type of [place] to enter    Entrance Stairs-Number of Steps 2    Entrance Stairs-Rails Can reach both    [place] One level    [place] - single point;Shower seat      Prior Function   Level of Independence Independent with basic ADLs    Vocation Retired      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      Observation/Other Assessments   Focus on Therapeutic Outcomes (FOTO)  63% function  SPARE 55.7   70% predicted     Posture/Postural Control   Posture/Postural Control Postural limitations    Postural  Limitations Rounded Shoulders;Forward head      ROM / Strength   AROM / PROM / Strength AROM;Strength;PROM      AROM   AROM Assessment Site Lumbar    Lumbar Flexion 52   end range stiffness in the back   Lumbar Extension 10    Lumbar - Right Side Bend 30    Lumbar - Left Side Bend 10   produced concrodant pain with referral to lateral thigh     Strength   Strength Assessment Site Hip;Knee    Right/Left Hip Right;Left    Right Hip Flexion 5/5    Right Hip Extension 4/5    Right Hip ABduction 4/5    Right Hip ADduction 4/5    Left Hip Flexion 4-/5    Left Hip Extension --   produced concordant back pain   Left Hip ABduction 4/5    Left Hip ADduction 4/5    Right/Left Knee Right;Left    Right Knee Flexion 5/5    Right Knee Extension 5/5    Left Knee Flexion 3+/5   limited due to knee pain   Left Knee Extension 3+/5   limited due to knee pain     Palpation   SI assessment  limited SIJ assessment due to high irritability    Palpation comment TTP for bil lumbar paraspinals L>R, and at the SIJ      Ambulation/Gait   Gait Pattern Step-through pattern;Decreased stride length;Trendelenburg;Decreased trunk rotation                      Objective measurements completed on examination: See above findings.               PT Education - 04/02/20 0950    Education Details evaluation findings, POC, goals, HEP with proper form/ rationale    Person(s) Educated Patient    Methods Explanation;Verbal cues;Handout    Comprehension Verbalized understanding;Verbal cues required            PT Short Term Goals - 04/02/20 1040      PT SHORT TERM GOAL #1   Title pt to be IND with intial HEP    Time 3    Period Weeks    Status New    Target Date 04/23/20      PT SHORT TERM GOAL #2   Title pt to verbalize/ demo efficient posture  and gait mechanics to reduce and prevent hp/ low back pain    Time 3    Period Weeks    Status New    Target Date 04/23/20              PT Long Term Goals - 04/02/20 1041      PT LONG TERM GOAL #1   Title increase trunk flexion by >/= 10 degrees and extension/ L sidebending by >/= 8 degrees with </= 2/10 max pain for functional mobility required for ADLs    Time 6    Period Weeks    Status New    Target Date 05/14/20      PT LONG TERM GOAL #2   Title increase bil LE gross strength to >/= 4+/5 to promote hip stability and efficient posture/ lfiting mechanics    Time 6    Period Weeks    Status New    Target Date 05/14/20      PT LONG TERM GOAL #3   Title pt to be able to sit /stand and walk for >/ 60 min with </= 2/10 max pain for funcitonal endurance    Time 6    Period Weeks    Status New    Target Date 05/14/20      PT LONG TERM GOAL #4   Title increase FOTO score to >/=70% to demo improvement in function    Time 6    Period Weeks    Status New    Target Date 05/14/20      PT LONG TERM GOAL #5   Title pt to be IND with all HEP given and is able ot maintain and progress current LOF IND.    Time 6    Period Weeks    Status New    Target Date 05/14/20                  Plan - 04/02/20 0950    Clinical Impression Statement pt is a pleasant 65 y.o F presenting to OPPT with hx of low back pain noting exacerbation 1 year ago with no specific MOI. she demonstrates limited trunk extension with report of concordant pain in the L low back, and limited flexion secondary to stiffness. She demonstrated weakness in the hips with report of low back pain, and L knee secondary to L knee pain. limited testing performed due to irritability of symptoms noted during AROM assessment, preliminary testing suggest SIJ invovlement. she reported reduction in low back pain following hamstring stretching. She would benefit from physical therapy to reduce back pain, improve trunk mobility, increase bil hip/ core strength and maximize her function by addressing the deficits listed.    Personal Factors and Comorbidities  Comorbidity 3+;Age    Comorbidities hx of LBP before, MI, CVA,    Examination-Activity Limitations Stand    Stability/Clinical Decision Making Unstable/Unpredictable    Clinical Decision Making High    Rehab Potential Good    PT Frequency 2x / week    PT Duration 6 weeks    PT Treatment/Interventions ADLs/Self Care Home Management;Cryotherapy;Electrical Stimulation;Iontophoresis 4mg /ml Dexamethasone;Moist Heat;Ultrasound;Traction;Gait training;Stair training;Therapeutic activities;Therapeutic exercise;Balance training;Neuromuscular re-education;Manual techniques;Patient/family education;Passive range of motion;Dry needling;Taping    PT Next Visit Plan review / update HEP PRN- high irritability potential SIJ involvement on the R, continue trialing hamstring stretch and add flexor MET techniques. hip and core strengthening, gait training, modalities PRN    PT Home Exercise Plan [id number] - hamstring stretch ( supine/ seated), supine marching,  LTR, supine PPT,    Consulted and Agree with Plan of Care Patient           Patient will benefit from skilled therapeutic intervention in order to improve the following deficits and impairments:  Improper body mechanics,Increased muscle spasms,Decreased strength,Postural dysfunction,Abnormal gait,Pain,Decreased activity tolerance,Decreased endurance,Decreased balance,Decreased range of motion  Visit Diagnosis: Chronic left-sided low back pain with left-sided sciatica - Plan: PT plan of care cert/re-cert  Other abnormalities of gait and mobility - Plan: PT plan of care cert/re-cert  Muscle spasm of back - Plan: PT plan of care cert/re-cert  Muscle weakness (generalized) - Plan: PT plan of care cert/re-cert     Problem List Patient Active Problem List   Diagnosis Date Noted  . Pain and swelling of left knee 08/21/2018  . Radiculopathy of lumbosacral region 06/04/2015  . Burning pain right low back 08/17/2014  . Shingles 02/25/2014  . Knee pain,  right 09/19/2013  . Viral syndrome 01/29/2013  . S/P TKR (total knee replacement) 04/19/2012  . HTN (hypertension) 03/17/2012  . CAD (coronary artery disease) 03/17/2012  . HLD (hyperlipidemia) 03/17/2012  . Anemia 03/17/2012  . DJD (degenerative joint disease) of knee 03/17/2012    Starr Lake PT, DPT, LAT, ATC  04/02/20  10:47 AM      [place] [facility] [address] [place], [place], [address] Phone: [phone]   Fax:  [phone]  Name: Teresa [name] MRN: [id number] Date of Birth: [birth year]     Pt discharged due to not returning since evaluation.  Lenea Bywater PT, DPT, LAT, ATC  04/29/20  1:27 PM

## 2020-04-17 ENCOUNTER — Ambulatory Visit: Payer: Medicare (Managed Care) | Admitting: Physical Therapy

## 2020-04-21 ENCOUNTER — Ambulatory Visit: Payer: Medicare (Managed Care) | Admitting: Physical Therapy

## 2020-04-23 ENCOUNTER — Ambulatory Visit: Payer: Medicare (Managed Care) | Admitting: Physical Therapy

## 2020-04-28 ENCOUNTER — Encounter: Payer: Medicare PPO | Admitting: Physical Therapy

## 2020-04-30 ENCOUNTER — Encounter: Payer: Medicare PPO | Admitting: Physical Therapy

## 2020-05-05 ENCOUNTER — Other Ambulatory Visit: Payer: Self-pay | Admitting: Family

## 2020-05-05 ENCOUNTER — Other Ambulatory Visit: Payer: Self-pay

## 2020-05-05 ENCOUNTER — Encounter: Payer: Medicare PPO | Admitting: Physical Therapy

## 2020-05-05 ENCOUNTER — Ambulatory Visit
Admission: RE | Admit: 2020-05-05 | Discharge: 2020-05-05 | Disposition: A | Discharge status: Home / Self Care | Payer: Medicare (Managed Care) | Source: Ambulatory Visit | Attending: Family | Admitting: Family

## 2020-05-05 DIAGNOSIS — M25552 Pain in left hip: Secondary | ICD-10-CM

## 2020-05-07 ENCOUNTER — Encounter: Payer: Medicare PPO | Admitting: Physical Therapy

## 2020-05-28 ENCOUNTER — Encounter (HOSPITAL_COMMUNITY): Payer: Self-pay

## 2020-05-28 ENCOUNTER — Other Ambulatory Visit: Payer: Self-pay

## 2020-05-28 DIAGNOSIS — R519 Headache, unspecified: Secondary | ICD-10-CM | POA: Diagnosis not present

## 2020-05-28 DIAGNOSIS — Z96651 Presence of right artificial knee joint: Secondary | ICD-10-CM | POA: Insufficient documentation

## 2020-05-28 DIAGNOSIS — I10 Essential (primary) hypertension: Secondary | ICD-10-CM | POA: Diagnosis not present

## 2020-05-28 DIAGNOSIS — Z79899 Other long term (current) drug therapy: Secondary | ICD-10-CM | POA: Diagnosis not present

## 2020-05-28 DIAGNOSIS — Z87891 Personal history of nicotine dependence: Secondary | ICD-10-CM | POA: Insufficient documentation

## 2020-05-28 DIAGNOSIS — I2510 Atherosclerotic heart disease of native coronary artery without angina pectoris: Secondary | ICD-10-CM | POA: Insufficient documentation

## 2020-05-28 DIAGNOSIS — M79672 Pain in left foot: Secondary | ICD-10-CM | POA: Diagnosis present

## 2020-05-28 DIAGNOSIS — M542 Cervicalgia: Secondary | ICD-10-CM | POA: Insufficient documentation

## 2020-05-28 DIAGNOSIS — Z7982 Long term (current) use of aspirin: Secondary | ICD-10-CM | POA: Insufficient documentation

## 2020-05-28 DIAGNOSIS — Y92093 Driveway of other non-institutional residence as the place of occurrence of the external cause: Secondary | ICD-10-CM | POA: Diagnosis not present
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
## 2020-05-29 ENCOUNTER — Emergency Department (HOSPITAL_COMMUNITY): Payer: Medicare (Managed Care)

## 2020-05-29 ENCOUNTER — Emergency Department (HOSPITAL_COMMUNITY)
Admission: EM | Admit: 2020-05-29 | Discharge: 2020-05-29 | Disposition: A | Discharge status: Home / Self Care | Payer: Medicare (Managed Care) | Attending: Emergency Medicine | Admitting: Emergency Medicine

## 2020-05-29 DIAGNOSIS — M7918 Myalgia, other site: Secondary | ICD-10-CM

## 2020-05-29 DIAGNOSIS — M79672 Pain in left foot: Secondary | ICD-10-CM | POA: Diagnosis not present

## 2020-05-29 MED ORDER — CYCLOBENZAPRINE HCL 10 MG PO TABS: 10.0000 mg | ORAL_TABLET | Freq: Two times a day (BID) | ORAL | 0 refills | Status: AC | PRN

## 2020-05-29 MED ORDER — HYDROCODONE-ACETAMINOPHEN 5-325 MG PO TABS: 1.0000 | ORAL_TABLET | ORAL | 0 refills | Status: DC | PRN

## 2020-05-29 MED ORDER — HYDROCODONE-ACETAMINOPHEN 5-325 MG PO TABS
1.0000 | ORAL_TABLET | Freq: Once | ORAL | Status: AC
  Administered 2020-05-29: 1 via ORAL
  Filled 2020-05-29: qty 1

## 2020-05-29 MED ORDER — IBUPROFEN 600 MG PO TABS: 600.0000 mg | ORAL_TABLET | Freq: Four times a day (QID) | ORAL | 0 refills | Status: AC | PRN

## 2020-05-29 NOTE — Discharge Instructions (Signed)
Take medications as prescribed. Follow up with your doctor in 3-4 days for recheck of symptoms as needed. Cold compresses for the 1st 48 hours, then alternate heat and ice for comfort.   Return to the ED with any new or worsening symptoms.
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
## 2021-09-22 ENCOUNTER — Other Ambulatory Visit (HOSPITAL_COMMUNITY): Payer: Self-pay | Admitting: Nurse Practitioner

## 2021-09-22 DIAGNOSIS — I739 Peripheral vascular disease, unspecified: Secondary | ICD-10-CM

## 2021-10-05 ENCOUNTER — Ambulatory Visit (HOSPITAL_COMMUNITY)
Admission: RE | Admit: 2021-10-05 | Discharge: 2021-10-05 | Disposition: A | Discharge status: Home / Self Care | Payer: Medicare Other | Source: Ambulatory Visit | Attending: Nurse Practitioner | Admitting: Nurse Practitioner

## 2021-10-05 DIAGNOSIS — I739 Peripheral vascular disease, unspecified: Secondary | ICD-10-CM | POA: Insufficient documentation

## 2022-06-16 ENCOUNTER — Telehealth: Payer: Self-pay | Admitting: Dermatology

## 2022-06-16 NOTE — Telephone Encounter (Signed)
Attempted to contact patient in regards to referral, no answer so voicemail was left for patient to call back to schedule.

## 2022-06-28 ENCOUNTER — Ambulatory Visit: Payer: Medicare HMO | Admitting: Dermatology

## 2022-06-28 ENCOUNTER — Encounter: Payer: Self-pay | Admitting: Dermatology

## 2022-06-28 VITALS — BP 140/87

## 2022-06-28 DIAGNOSIS — D3612 Benign neoplasm of peripheral nerves and autonomic nervous system, upper limb, including shoulder: Secondary | ICD-10-CM

## 2022-06-28 DIAGNOSIS — L720 Epidermal cyst: Secondary | ICD-10-CM

## 2022-06-28 NOTE — Patient Instructions (Addendum)
[email or web address] [employer], DO [address], [place], [place] [address]  < 1 mi [phone]    Due to recent changes in healthcare laws, you may see results of your pathology and/or laboratory studies on MyChart before the doctors have had a chance to review them. We understand that in some cases there may be results that are confusing or concerning to you. Please understand that not all results are received at the same time and often the doctors may need to interpret multiple results in order to provide you with the best plan of care or course of treatment. Therefore, we ask that you please give [place] 2 business days to thoroughly review all your results before contacting the office for clarification. Should we see a critical lab result, you will be contacted sooner.   If You Need Anything After Your Visit  If you have any questions or concerns for your doctor, please call our main line at [phone] If no one answers, please leave a voicemail as directed and we will return your call as soon as possible. Messages left after 4 pm will be answered the following business day.   You may also send [place] a message via MyChart. We typically respond to MyChart messages within 1-2 business days.  For prescription refills, please ask your pharmacy to contact our office. Our fax number is [phone].  If you have an urgent issue when the clinic is closed that cannot wait until the next business day, you can page your doctor at the number below.    Please note that while we do our best to be available for urgent issues outside of office hours, we are not available 24/7.   If you have an urgent issue and are unable to reach [place], you may choose to seek medical care at your doctor's office, retail clinic, urgent care center, or emergency room.  If you have a medical emergency, please immediately call 911 or go to the emergency department. In the event of inclement weather, please  call our main line at [phone] for an update on the status of any delays or closures.  Dermatology Medication Tips: Please keep the boxes that topical medications come in in order to help keep track of the instructions about where and how to use these. Pharmacies typically print the medication instructions only on the boxes and not directly on the medication tubes.   If your medication is too expensive, please contact our office at [phone] or send [place] a message through MyChart.   We are unable to tell what your co-pay for medications will be in advance as this is different depending on your insurance coverage. However, we may be able to find a substitute medication at lower cost or fill out paperwork to get insurance to cover a needed medication.   If a prior authorization is required to get your medication covered by your insurance company, please allow [place] 1-2 business days to complete this process.  Drug prices often vary depending on where the prescription is filled and some pharmacies may offer cheaper prices.  The website www.goodrx.com contains coupons for medications through different pharmacies. The prices here do not account for what the cost may be with help from insurance (it may be cheaper with your insurance), but the website can give you the price if you did not use any insurance.  - You can print the associated coupon and take it with your prescription to the pharmacy.  - You  may also stop by our office during regular business hours and pick up a GoodRx coupon card.  - If you need your prescription sent electronically to a different pharmacy, notify our office through [facility] or by phone at [phone]

## 2022-06-28 NOTE — Progress Notes (Unsigned)
   New Patient Visit   Subjective  Teresa [name] is a 67 y.o. female who presents for the following: Lump on the left jaw line x 5 years. No pain, no drainage, no change in size. The area will sometimes swell. No prior treatment   The following portions of the chart were reviewed this encounter and updated as appropriate: medications, allergies, medical history  Review of Systems:  No other skin or systemic complaints except as noted in HPI or Assessment and Plan.  Objective  Well appearing patient in no apparent distress; mood and affect are within normal limits.   A focused examination was performed of the following areas: Left cheek  Relevant exam findings are noted in the Assessment and Plan.    Assessment & Plan   EPIDERMAL INCLUSION CYST Exam:   2cm Subcutaneous nodule at left jaw line of cheek  Benign-appearing. Exam most consistent with an epidermal inclusion cyst. Discussed that a cyst is a benign growth that can grow over time and sometimes get irritated or inflamed. Recommend observation if it is not bothersome. Discussed option of surgical excision to remove it if it is growing, symptomatic, or other changes noted. Please call for new or changing lesions so they can be evaluated.  Refer to a general surgery for excision.  Refer to Dr. Ulice Bold   Neurofibroma Exam: Right upper arm  Treatment Plan: Reassurance given      No follow-ups on file.  Teresa [name], CMA, am acting as scribe for Langston Reusing, MD.   Documentation: I have reviewed the above documentation for accuracy and completeness, and I agree with the above.  Langston Reusing, MD

## 2022-07-01 ENCOUNTER — Telehealth: Payer: Self-pay

## 2022-07-01 NOTE — Telephone Encounter (Signed)
Reminder letter mailed to the patient.

## 2022-08-09 ENCOUNTER — Encounter: Payer: Self-pay | Admitting: Plastic Surgery

## 2022-08-09 ENCOUNTER — Ambulatory Visit: Payer: Medicare HMO | Admitting: Plastic Surgery

## 2022-08-09 VITALS — BP 159/77 | HR 69 | Ht 65.0 in | Wt 181.4 lb

## 2022-08-09 DIAGNOSIS — L989 Disorder of the skin and subcutaneous tissue, unspecified: Secondary | ICD-10-CM

## 2022-08-09 NOTE — Progress Notes (Signed)
Referring Provider Terri Piedra, DO [address] [place],  [place] [address]   CC:  Chief Complaint  Patient presents with   Consult      Teresa [name] is an 67 y.o. female.  HPI: [name] is a 67 year old female who is referred for evaluation of a mass on the left cheek cheek.  She is unsure how long it has been there.  She denies any significant pain or drainage.  Allergies  Allergen Reactions   Ace Inhibitors Swelling    Angioedema    Outpatient Encounter Medications as of 08/09/2022  Medication Sig   aspirin EC 81 MG tablet Take 1 tablet (81 mg total) by mouth daily.   Capsaicin-Menthol-Methyl Sal (CAPSAICIN-METHYL SAL-MENTHOL) 0.025-1-12 % CREA Apply 1 application topically 4 (four) times daily.   cyclobenzaprine (FLEXERIL) 10 MG tablet Take 1 tablet (10 mg total) by mouth 2 (two) times daily as needed for muscle spasms.   gabapentin (NEURONTIN) 300 MG capsule Take one capsule am and at lunch and 2 capsules at bedtime   HYDROcodone-acetaminophen (NORCO/VICODIN) 5-325 MG tablet Take 1 tablet by mouth every 4 (four) hours as needed for moderate pain or severe pain.   LORazepam (ATIVAN) 1 MG tablet Take one by mouth about an hour before MRI and may repeat once at time of mri if necessary   methocarbamol (ROBAXIN) 500 MG tablet Take 0.5-1 tablets (250-500 mg total) by mouth 3 (three) times daily.   Olmesartan-Amlodipine-HCTZ (TRIBENZOR) 40-5-25 MG TABS Take 1 tablet by mouth daily.   pregabalin (LYRICA) 75 MG capsule Take 1 capsule (75 mg total) by mouth 2 (two) times daily.   ibuprofen (ADVIL) 600 MG tablet Take 1 tablet (600 mg total) by mouth every 6 (six) hours as needed.   No facility-administered encounter medications on file as of 08/09/2022.     Past Medical History:  Diagnosis Date   Acute non Q wave MI (myocardial infarction), subsequent episode    Angioedema    Arthritis    Bronchitis    CVA (cerebral infarction)    Hyperlipidemia     Hypertension    Stroke [facility])     Past Surgical History:  Procedure Laterality Date   KNEE ARTHROPLASTY Right 03/31/2012   Procedure: COMPUTER ASSISTED TOTAL KNEE ARTHROPLASTY;  Surgeon: Harvie Junior, MD;  Location: [facility] OR;  Service: Orthopedics;  Laterality: Right;   KNEE ARTHROSCOPY W/ MENISCAL REPAIR     right   NECK SURGERY     rod in neck    No family history on file.  Social History   Social History Narrative   Not on file     Review of Systems General: Denies fevers, chills, weight loss CV: Denies chest pain, shortness of breath, palpitations Skin: Small mass on the left cheek.  No obvious trauma and no previous infection.  Physical Exam    08/09/2022   11:15 AM 08/09/2022   10:54 AM 06/28/2022    2:21 PM  Vitals with BMI  Height  5\' 5"    Weight  181 lbs 6 oz   BMI  30.19   Systolic 159 170 604  Diastolic 77 84 87  Pulse  69     General:  No acute distress,  Alert and oriented, Non-Toxic, Normal speech and affect Integument: Patient has a 1 x 1 cm mass in the left cheek at the mandibular border.  This is consistent with an epidermal inclusion cyst. Mammogram: Patient is past due for her mammogram.  Will call to remind her. Assessment/Plan Skin lesion, left cheek: This is consistent with a sebaceous cyst or epidermal inclusion cyst.  Will plan to excise under local in the office.  Patient understands that there is going to be a small scar.  She will have sutures that need to be removed.  There is a risk of recurrence.  Will proceed at her request.  Santiago Glad 08/09/2022, 12:44 PM

## 2022-09-13 ENCOUNTER — Ambulatory Visit: Payer: Medicare HMO | Admitting: Plastic Surgery

## 2022-09-13 ENCOUNTER — Encounter: Payer: Self-pay | Admitting: Plastic Surgery

## 2022-09-13 VITALS — BP 115/71 | HR 66 | Resp 18 | Wt 176.0 lb

## 2022-09-13 DIAGNOSIS — L720 Epidermal cyst: Secondary | ICD-10-CM

## 2022-09-13 DIAGNOSIS — D489 Neoplasm of uncertain behavior, unspecified: Secondary | ICD-10-CM

## 2022-09-13 NOTE — Progress Notes (Signed)
Procedure Note  Preoperative Dx: Subcutaneous mass left mandibular border  Postoperative Dx: Same  Procedure: Excision of subcutaneous mass  Anesthesia: Lidocaine 1% with 1:100,000 epinephrine and 0.25% Sensorcaine   Indication for Procedure: Removal for pathologic diagnosis  Description of Procedure: Risks and complications were explained to the patient including the possibility of recurrence and the need for additional procedures.  Consent was confirmed and the patient understands the risks and benefits.  The potential complications and alternatives were explained and the patient consents.  The patient expressed understanding the option of not having the procedure and the risks of a scar.  Time out was called and all information was confirmed to be correct.    The area was prepped and drapped.  Local anesthetic was injected in the subcutaneous tissues.  After waiting for the local to take affect an elliptical incision was made around the obvious punctum.  A combination of sharp and blunt dissection were used to remove what appeared to be an epidermal inclusion cyst.  The cyst wall and its contents were removed and the surgical wound was irrigated with peroxide.  After obtaining hemostasis, the surgical wound was closed with 5-0 Monocryl in the subcutaneous tissues and 5-0 Prolene sutures and the skin.  The surgical wound measured 1 cm.  A dressing was applied.  The patient was given instructions on how to care for the area and a follow up appointment.  Teresa [name] tolerated the procedure well and there were no complications. The specimen was sent to pathology.

## 2022-09-20 ENCOUNTER — Ambulatory Visit: Payer: Medicare HMO | Admitting: Surgical

## 2022-09-20 ENCOUNTER — Encounter: Payer: Self-pay | Admitting: Surgical

## 2022-09-20 VITALS — BP 143/75 | HR 74 | Ht 65.0 in | Wt 186.0 lb

## 2022-09-20 DIAGNOSIS — Z9889 Other specified postprocedural states: Secondary | ICD-10-CM

## 2022-09-20 DIAGNOSIS — D489 Neoplasm of uncertain behavior, unspecified: Secondary | ICD-10-CM

## 2022-09-20 NOTE — Progress Notes (Signed)
Patient is a 67 year old female here for follow-up after excision of left mandibular border subcutaneous mass with Dr. Ladona Ridgel on 09/13/2022.  She is 1 week postop.  5-0 Prolene was used.  Patient is doing well today, she reports she is not having any issues.  She is aware that pathology is not yet resulted  On exam left cheek incision is intact and healing well.  3 Prolene sutures are noted.  These were removed.  Patient tolerated this well.  There is no erythema or cellulitic changes.  There is some firmness noted with palpation, but no subcutaneous fluid collection.  Recommend virtual visit in 1 week to discuss pathology as results are not yet available. We discussed begin with light massage in 5 days, begin using scar cream as well.  Recommend use of sunscreen to prevent discoloration from sun exposure.  No signs infection or concern on exam.

## 2022-09-27 ENCOUNTER — Ambulatory Visit (INDEPENDENT_AMBULATORY_CARE_PROVIDER_SITE_OTHER): Payer: Medicare HMO | Admitting: Surgical

## 2022-09-27 DIAGNOSIS — Z9889 Other specified postprocedural states: Secondary | ICD-10-CM

## 2022-09-27 DIAGNOSIS — D489 Neoplasm of uncertain behavior, unspecified: Secondary | ICD-10-CM

## 2022-09-27 NOTE — Progress Notes (Signed)
Patient with a mass removed from her left mandible, she had a follow-up last week on 09/20/2022.  It was healing well.  She presents today for virtual visit to discuss pathology as it was not yet resulted at that time.  Pathology is available today: Epidermal inclusion cyst.  Patient reports she is doing well.  She has no complaints.    The patient gave consent to have this visit done by telemedicine / virtual visit, two identifiers were used to identify patient. This is also consent for access the chart and treat the patient via this visit. The patient is located in [place].  I, the provider, am at the office.  We spent 2 minutes together for the visit.  Joined by telephone.

## 2023-09-13 ENCOUNTER — Other Ambulatory Visit: Payer: Self-pay

## 2023-09-13 ENCOUNTER — Emergency Department (HOSPITAL_COMMUNITY)
Admission: EM | Admit: 2023-09-13 | Discharge: 2023-09-13 | Disposition: A | Discharge status: Home / Self Care | Attending: Emergency Medicine | Admitting: Emergency Medicine

## 2023-09-13 ENCOUNTER — Encounter (HOSPITAL_COMMUNITY): Payer: Self-pay

## 2023-09-13 DIAGNOSIS — Z7982 Long term (current) use of aspirin: Secondary | ICD-10-CM | POA: Diagnosis not present

## 2023-09-13 DIAGNOSIS — Z23 Encounter for immunization: Secondary | ICD-10-CM | POA: Insufficient documentation

## 2023-09-13 DIAGNOSIS — T310 Burns involving less than 10% of body surface: Secondary | ICD-10-CM | POA: Diagnosis not present

## 2023-09-13 DIAGNOSIS — T24212A Burn of second degree of left thigh, initial encounter: Secondary | ICD-10-CM | POA: Diagnosis not present

## 2023-09-13 DIAGNOSIS — X101XXA Contact with hot food, initial encounter: Secondary | ICD-10-CM | POA: Diagnosis not present

## 2023-09-13 DIAGNOSIS — T24012A Burn of unspecified degree of left thigh, initial encounter: Secondary | ICD-10-CM | POA: Diagnosis present

## 2023-09-13 MED ORDER — SILVER SULFADIAZINE 1 % EX CREA
TOPICAL_CREAM | Freq: Once | CUTANEOUS | Status: AC
  Administered 2023-09-13: 1 via TOPICAL
  Filled 2023-09-13: qty 85

## 2023-09-13 MED ORDER — OXYCODONE-ACETAMINOPHEN 5-325 MG PO TABS
1.0000 | ORAL_TABLET | ORAL | Status: DC | PRN
  Administered 2023-09-13: 1 via ORAL
  Filled 2023-09-13: qty 1

## 2023-09-13 MED ORDER — BACITRACIN ZINC 500 UNIT/GM EX OINT
TOPICAL_OINTMENT | Freq: Two times a day (BID) | CUTANEOUS | Status: DC
  Filled 2023-09-13: qty 4.5

## 2023-09-13 MED ORDER — TETANUS-DIPHTH-ACELL PERTUSSIS 5-2.5-18.5 LF-MCG/0.5 IM SUSY
0.5000 mL | PREFILLED_SYRINGE | Freq: Once | INTRAMUSCULAR | Status: AC
  Administered 2023-09-13: 0.5 mL via INTRAMUSCULAR
  Filled 2023-09-13: qty 0.5

## 2023-09-13 MED ORDER — OXYCODONE-ACETAMINOPHEN 5-325 MG PO TABS: 1.0000 | ORAL_TABLET | Freq: Four times a day (QID) | ORAL | 0 refills | Status: AC | PRN
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
## 2023-09-13 NOTE — ED Notes (Signed)
 Patient Alert and oriented to baseline. Stable and ambulatory to baseline. Patient verbalized understanding of the discharge instructions.  Patient belongings were taken by the patient.

## 2024-01-24 ENCOUNTER — Other Ambulatory Visit: Payer: Self-pay | Admitting: Nurse Practitioner

## 2024-01-24 DIAGNOSIS — N63 Unspecified lump in unspecified breast: Secondary | ICD-10-CM

## 2024-02-29 ENCOUNTER — Other Ambulatory Visit: Payer: Self-pay | Admitting: Nurse Practitioner

## 2024-02-29 DIAGNOSIS — N63 Unspecified lump in unspecified breast: Secondary | ICD-10-CM

## 2024-02-29 DIAGNOSIS — R928 Other abnormal and inconclusive findings on diagnostic imaging of breast: Secondary | ICD-10-CM

## 2024-03-05 ENCOUNTER — Encounter

## 2024-03-05 ENCOUNTER — Other Ambulatory Visit

## 2024-03-14 ENCOUNTER — Ambulatory Visit
Admission: RE | Admit: 2024-03-14 | Discharge: 2024-03-14 | Disposition: A | Source: Ambulatory Visit | Attending: Nurse Practitioner

## 2024-03-14 ENCOUNTER — Other Ambulatory Visit: Payer: Self-pay | Admitting: Nurse Practitioner

## 2024-03-14 DIAGNOSIS — R928 Other abnormal and inconclusive findings on diagnostic imaging of breast: Secondary | ICD-10-CM

## 2024-03-14 DIAGNOSIS — N6314 Unspecified lump in the right breast, lower inner quadrant: Secondary | ICD-10-CM

## 2024-03-14 DIAGNOSIS — R599 Enlarged lymph nodes, unspecified: Secondary | ICD-10-CM

## 2024-03-20 ENCOUNTER — Other Ambulatory Visit

## 2024-03-20 ENCOUNTER — Inpatient Hospital Stay: Admission: RE | Admit: 2024-03-20 | Source: Ambulatory Visit

## 2024-03-27 ENCOUNTER — Other Ambulatory Visit

## 2024-04-13 ENCOUNTER — Other Ambulatory Visit
